# Patient Record
Sex: Female | Born: 1985 | Race: Black or African American | Hispanic: No | Marital: Married | Smoking: Current some day smoker
Health system: Southern US, Community
[De-identification: ages and names within clinical notes are randomized; demographics above are authoritative.]

## PROBLEM LIST (undated history)

## (undated) ENCOUNTER — Inpatient Hospital Stay (HOSPITAL_COMMUNITY): Payer: Self-pay

## (undated) DIAGNOSIS — O34219 Maternal care for unspecified type scar from previous cesarean delivery: Secondary | ICD-10-CM

## (undated) DIAGNOSIS — A599 Trichomoniasis, unspecified: Secondary | ICD-10-CM

## (undated) DIAGNOSIS — T7840XA Allergy, unspecified, initial encounter: Secondary | ICD-10-CM

## (undated) DIAGNOSIS — J301 Allergic rhinitis due to pollen: Secondary | ICD-10-CM

## (undated) DIAGNOSIS — IMO0002 Reserved for concepts with insufficient information to code with codable children: Secondary | ICD-10-CM

## (undated) DIAGNOSIS — R87619 Unspecified abnormal cytological findings in specimens from cervix uteri: Secondary | ICD-10-CM

## (undated) DIAGNOSIS — S060X9A Concussion with loss of consciousness of unspecified duration, initial encounter: Secondary | ICD-10-CM

## (undated) DIAGNOSIS — A630 Anogenital (venereal) warts: Secondary | ICD-10-CM

## (undated) HISTORY — DX: Allergy, unspecified, initial encounter: T78.40XA

## (undated) HISTORY — PX: WISDOM TOOTH EXTRACTION: SHX21

## (undated) HISTORY — PX: ABDOMINAL SURGERY: SHX537

---
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
## 2010-01-05 DIAGNOSIS — A599 Trichomoniasis, unspecified: Secondary | ICD-10-CM

## 2010-01-05 HISTORY — DX: Trichomoniasis, unspecified: A59.9

## 2010-02-13 ENCOUNTER — Emergency Department (HOSPITAL_COMMUNITY): Payer: Medicaid Other

## 2010-02-13 ENCOUNTER — Emergency Department (HOSPITAL_COMMUNITY)
Admission: EM | Admit: 2010-02-13 | Discharge: 2010-02-13 | Disposition: A | Discharge status: Home / Self Care | Payer: Medicaid Other | Attending: Emergency Medicine | Admitting: Emergency Medicine

## 2010-02-13 DIAGNOSIS — O34599 Maternal care for other abnormalities of gravid uterus, unspecified trimester: Secondary | ICD-10-CM | POA: Insufficient documentation

## 2010-02-13 DIAGNOSIS — N83209 Unspecified ovarian cyst, unspecified side: Secondary | ICD-10-CM | POA: Insufficient documentation

## 2010-02-13 DIAGNOSIS — O9989 Other specified diseases and conditions complicating pregnancy, childbirth and the puerperium: Secondary | ICD-10-CM | POA: Insufficient documentation

## 2010-02-13 DIAGNOSIS — R109 Unspecified abdominal pain: Secondary | ICD-10-CM | POA: Insufficient documentation

## 2010-02-13 DIAGNOSIS — N898 Other specified noninflammatory disorders of vagina: Secondary | ICD-10-CM | POA: Insufficient documentation

## 2010-02-13 LAB — URINALYSIS, ROUTINE W REFLEX MICROSCOPIC
Specific Gravity, Urine: 1.031 — ABNORMAL HIGH (ref 1.005–1.030)
Urine Glucose, Fasting: NEGATIVE mg/dL
pH: 5 (ref 5.0–8.0)

## 2010-02-13 LAB — WET PREP, GENITAL

## 2010-02-13 LAB — URINE MICROSCOPIC-ADD ON

## 2010-02-16 LAB — GC/CHLAMYDIA PROBE AMP, GENITAL
Chlamydia, DNA Probe: NEGATIVE
GC Probe Amp, Genital: NEGATIVE

## 2010-03-28 ENCOUNTER — Inpatient Hospital Stay (HOSPITAL_COMMUNITY)
Admission: AD | Admit: 2010-03-28 | Discharge: 2010-03-28 | Disposition: A | Discharge status: Home / Self Care | Payer: Medicaid Other | Source: Ambulatory Visit | Attending: Obstetrics & Gynecology | Admitting: Obstetrics & Gynecology

## 2010-03-28 DIAGNOSIS — O99891 Other specified diseases and conditions complicating pregnancy: Secondary | ICD-10-CM | POA: Insufficient documentation

## 2010-03-28 DIAGNOSIS — R109 Unspecified abdominal pain: Secondary | ICD-10-CM | POA: Insufficient documentation

## 2010-03-28 LAB — URINALYSIS, ROUTINE W REFLEX MICROSCOPIC
Bilirubin Urine: NEGATIVE
Nitrite: NEGATIVE
Specific Gravity, Urine: >1.03 — ABNORMAL HIGH (ref 1.005–1.030)
Urobilinogen, UA: 1 mg/dL (ref 0.0–1.0)

## 2010-03-28 LAB — URINE MICROSCOPIC-ADD ON

## 2010-06-13 ENCOUNTER — Other Ambulatory Visit (HOSPITAL_COMMUNITY): Payer: Self-pay | Admitting: Obstetrics and Gynecology

## 2010-06-13 DIAGNOSIS — O2690 Pregnancy related conditions, unspecified, unspecified trimester: Secondary | ICD-10-CM

## 2010-06-19 ENCOUNTER — Encounter (HOSPITAL_COMMUNITY): Payer: Self-pay

## 2010-06-19 ENCOUNTER — Ambulatory Visit (HOSPITAL_COMMUNITY)
Admission: RE | Admit: 2010-06-19 | Discharge: 2010-06-19 | Disposition: A | Discharge status: Home / Self Care | Payer: Medicaid Other | Source: Ambulatory Visit | Attending: Obstetrics and Gynecology | Admitting: Obstetrics and Gynecology

## 2010-06-19 DIAGNOSIS — Z363 Encounter for antenatal screening for malformations: Secondary | ICD-10-CM | POA: Insufficient documentation

## 2010-06-19 DIAGNOSIS — Z1389 Encounter for screening for other disorder: Secondary | ICD-10-CM | POA: Insufficient documentation

## 2010-06-19 DIAGNOSIS — O2690 Pregnancy related conditions, unspecified, unspecified trimester: Secondary | ICD-10-CM

## 2010-06-19 DIAGNOSIS — O358XX0 Maternal care for other (suspected) fetal abnormality and damage, not applicable or unspecified: Secondary | ICD-10-CM | POA: Insufficient documentation

## 2010-06-27 ENCOUNTER — Other Ambulatory Visit (HOSPITAL_COMMUNITY): Payer: Self-pay | Admitting: Obstetrics and Gynecology

## 2010-06-27 DIAGNOSIS — O358XX0 Maternal care for other (suspected) fetal abnormality and damage, not applicable or unspecified: Secondary | ICD-10-CM

## 2010-07-17 ENCOUNTER — Ambulatory Visit (HOSPITAL_COMMUNITY): Payer: Medicaid Other

## 2010-07-18 ENCOUNTER — Ambulatory Visit (HOSPITAL_COMMUNITY)
Admission: RE | Admit: 2010-07-18 | Discharge: 2010-07-18 | Disposition: A | Discharge status: Home / Self Care | Payer: Medicaid Other | Source: Ambulatory Visit | Attending: Obstetrics and Gynecology | Admitting: Obstetrics and Gynecology

## 2010-07-18 ENCOUNTER — Encounter (HOSPITAL_COMMUNITY): Payer: Self-pay

## 2010-07-18 VITALS — BP 131/80 | HR 115 | Wt 182.0 lb

## 2010-07-18 DIAGNOSIS — Z3689 Encounter for other specified antenatal screening: Secondary | ICD-10-CM | POA: Insufficient documentation

## 2010-07-18 DIAGNOSIS — O358XX0 Maternal care for other (suspected) fetal abnormality and damage, not applicable or unspecified: Secondary | ICD-10-CM | POA: Insufficient documentation

## 2010-07-18 NOTE — Progress Notes (Signed)
Report in AS/EPIC; follow-up in 4 weeks Discussed potential for pediatric renal transplant under special circumstances; will schedule a pediatric nephrology consult predelivery.

## 2010-07-18 NOTE — Progress Notes (Deleted)
Report in AS/EPIC; follow-up in 4 weeks.  Patient will be scheduled to consult with Pediatric nephrology for possibility of renal transplant of surviving newborn.

## 2010-07-18 NOTE — Progress Notes (Deleted)
Report in AS/EPIC; follow-up as needed.  Discussed potential for pediatric renal  transplant under special circumstances; will schedule a pediatric nephrology consult predelivery.

## 2010-08-15 ENCOUNTER — Ambulatory Visit (HOSPITAL_COMMUNITY)
Admission: RE | Admit: 2010-08-15 | Discharge: 2010-08-15 | Disposition: A | Discharge status: Home / Self Care | Payer: Medicaid Other | Source: Ambulatory Visit | Attending: Obstetrics and Gynecology | Admitting: Obstetrics and Gynecology

## 2010-08-15 VITALS — BP 116/72 | HR 100 | Wt 187.0 lb

## 2010-08-15 DIAGNOSIS — Z3689 Encounter for other specified antenatal screening: Secondary | ICD-10-CM | POA: Insufficient documentation

## 2010-08-15 DIAGNOSIS — O358XX0 Maternal care for other (suspected) fetal abnormality and damage, not applicable or unspecified: Secondary | ICD-10-CM | POA: Insufficient documentation

## 2010-08-15 NOTE — Progress Notes (Signed)
Ultrasound in AS/OBGYN/EPIC.  Follow up U/S scheduled 4 weeks.  Patient has asked to have information for visit with [facility] for possiblle delivery/management there. She may still deliver in this area.

## 2010-09-12 ENCOUNTER — Ambulatory Visit (HOSPITAL_COMMUNITY)
Admission: RE | Admit: 2010-09-12 | Discharge: 2010-09-12 | Disposition: A | Discharge status: Home / Self Care | Payer: Medicaid Other | Source: Ambulatory Visit | Attending: Obstetrics and Gynecology | Admitting: Obstetrics and Gynecology

## 2010-09-12 VITALS — BP 113/73 | HR 102 | Wt 197.0 lb

## 2010-09-12 DIAGNOSIS — Z3689 Encounter for other specified antenatal screening: Secondary | ICD-10-CM | POA: Insufficient documentation

## 2010-09-12 DIAGNOSIS — O358XX0 Maternal care for other (suspected) fetal abnormality and damage, not applicable or unspecified: Secondary | ICD-10-CM

## 2010-09-12 NOTE — Progress Notes (Signed)
Report in AS-OBGYN/EPIC; follow-up as needed Patient will continue care with [facility] and local group at her request.

## 2010-10-21 ENCOUNTER — Encounter (HOSPITAL_COMMUNITY): Payer: Self-pay

## 2010-11-11 ENCOUNTER — Inpatient Hospital Stay (HOSPITAL_COMMUNITY): Admission: AD | Admit: 2010-11-11 | Payer: Self-pay | Source: Ambulatory Visit | Admitting: Obstetrics and Gynecology

## 2011-09-17 ENCOUNTER — Other Ambulatory Visit: Payer: Self-pay

## 2011-09-17 LAB — OB RESULTS CONSOLE ABO/RH

## 2011-09-17 LAB — OB RESULTS CONSOLE RPR: RPR: NONREACTIVE

## 2011-09-17 LAB — OB RESULTS CONSOLE RUBELLA ANTIBODY, IGM: Rubella: IMMUNE

## 2011-09-17 LAB — OB RESULTS CONSOLE GC/CHLAMYDIA: Gonorrhea: NEGATIVE

## 2011-09-17 LAB — OB RESULTS CONSOLE HIV ANTIBODY (ROUTINE TESTING): HIV: NONREACTIVE

## 2011-09-25 ENCOUNTER — Ambulatory Visit (HOSPITAL_COMMUNITY): Payer: Medicaid Other | Attending: Obstetrics and Gynecology

## 2011-10-01 ENCOUNTER — Ambulatory Visit (HOSPITAL_COMMUNITY): Payer: Medicaid Other

## 2011-10-08 ENCOUNTER — Ambulatory Visit (HOSPITAL_COMMUNITY)
Admission: RE | Admit: 2011-10-08 | Discharge: 2011-10-08 | Disposition: A | Discharge status: Home / Self Care | Payer: Commercial Managed Care - PPO | Source: Ambulatory Visit | Attending: Obstetrics and Gynecology | Admitting: Obstetrics and Gynecology

## 2011-10-08 ENCOUNTER — Encounter (HOSPITAL_COMMUNITY): Payer: Self-pay

## 2011-10-08 DIAGNOSIS — O352XX0 Maternal care for (suspected) hereditary disease in fetus, not applicable or unspecified: Secondary | ICD-10-CM | POA: Insufficient documentation

## 2011-10-08 NOTE — Progress Notes (Signed)
Genetic Counseling  High-Risk Gestation Note  Appointment Date:  10/08/2011 Referred By: Alyssa Pila, MD Date of Birth:  30-Mar-1985 Partner:  Alyssa Green    Pregnancy History: [id number] Estimated Date of Delivery: 04/08/11 Estimated Gestational Age: [redacted]w[redacted]d Attending: Eulis Foster, MD   I met with [name] and her partner, 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 He was diagnosed postnatally and died at one week of age. The prenatal course was complicated by prenatal ultrasound findings of enlarged, echogenic fetal kidneys and fetal ventriculomegaly. Alyssa Green was born and received diagnosis and care through [facility]. The couple reported that genetic testing was performed and confirmed the diagnosis. Follow-up carrier testing has not yet been performed for [name] or [name]. They reported that follow-up was planned for them at [facility], but they did not keep the appointment given circumstances at the time. The couple reported no known consanguinity, though 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   Carnitine palmitoyltransferase II (CPT II) deficiency is a genetic disorder of long-chain fatty-acid oxidation. Three clinical presentations of CPT II deficiency are described: lethal neonatal form, severe infantile hepatocardiomuscular form, and myopathic form. The lethal neonatal form is characterized by liver failure with hypoketotic hypoglycemia, cardiomyopathy, heart arrhythmias, seizures and coma after fasting or infection, and facial abnormalities or  structural malformations, such as cystic renal dysplasia. Onset is typically within days after birth of the lethal neonatal form, with death typically occurring within days to months. There are reports in the literature of brain and/or renal abnormalities on fetal ultrasound identified in fetuses subsequently diagnosed to have CPT II deficiency.    We reviewed that CPT II deficiency is an autosomal recessive condition, caused by mutations in the CPT2 gene. We reviewed genes, chromosomes, and autosomal recessive inheritance in detail. In autosomal recessive inheritance, an individual has the condition when they inherit two copies of a particular nonworking gene change (mutation) of a particular gene. Carriers describe individuals who have one copy of the nonworking gene change and one copy of the typical gene. Carriers are typically asymptomatic. When both parents are carriers, each pregnancy has a 1 in 4 (25%) chance to inherit both copies of the nonworking gene from the parents and thus the condition. Each pregnancy together for a carrier couple also has a 1 in 2 (50%) chance to be a carrier, and a 1 in 4 (25%) chance to be neither affected nor a carrier. We discussed that males and females have equal chance to inherit the condition. We discussed that [name] and [name] are obligate carriers given that their son had CPT II deficiency. Molecular testing has not yet been performed for the couple to confirm their carrier status. The lethal neonatal form of CPT II deficiency is typically associated with homozygosity for particular severe pathologic variants. Medical records have been requested but not yet received confirming the particular CPT2 mutations identified in the couple's son.   Prenatal diagnosis for CPT II deficiency is available via CVS (approximately 10-[redacted] weeks gestation) or amniocentesis (second trimester) when the causative mutations have been previously identified via  molecular testing. We  reviewed the risks, benefits, and limitations of amniocentesis.  We reviewed the approximate 1 in 300-500 risk for complications, including spontaneous pregnancy loss. We discussed that parental molecular analysis would also need to be performed prior to or in conjunction with prenatal testing to confirm carrier status for the couple. We discussed that turnaround time for prenatal molecular CPT2 testing for the known familial mutation can range from two weeks (in the case that analysis from direct amniotic fluid can be performed) to approximately 4 weeks (in the case that cell culture is required to perform molecular analysis). We also discussed the option of targeted ultrasound in pregnancy given the association with fetal ultrasound findings and CPT II deficiency lethal neonatal form. However, ultrasound cannot identify or rule out all birth defects or genetic conditions, and the absence of fetal ultrasound findings would not rule out the presence of CPT II deficiency in a pregnancy.   We reviewed that options for a pregnancy prenatally diagnosed with CPT II deficiency lethal neonatal form would include continuing the pregnancy with expectant management or termination of pregnancy. The couple understands that the legal limit for TOP in [place] is [redacted] weeks gestation.   After consideration of all the options, the couple elected to return for amniocentesis at approximately [redacted] weeks gestation on 10/22/11 to pursue prenatal molecular testing for CPT II deficiency.  Given that the couple elected to pursue amniocentesis in the pregnancy for CPT II deficiency testing, they also elected to proceed with fetal karyotype analysis from amniocentesis and declined maternal serum fetal aneuploidy screening.   Additionally in the family history, [name] reported a female maternal first cousin with completely reversed organs. She reportedly has no medical issues from this and is healthy. 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 Heterotaxy syndrome is characterized by abnormal development and placement of the organs within the body. This can result in mild features to a poor prognosis depending upon the features present. Heterotaxy can be associated with various patterns of inheritance. In most families, heterotaxy is sporadic, in which case recurrence risk would not be increased for extended relatives.  Heterotaxy has also been described to follow autosomal recessive, X-linked recessive, and autosomal dominant inheritance, as well as be one feature of an underlying genetic syndrome or chromosome condition. In the absence of an identified etiology for [name], recurrence risk for relatives cannot accurately be assessed. However, given the reported family history and degree of relation, recurrence risk for the current pregnancy is likely low. We discussed that targeted ultrasound is available in the second trimester to assess fetal growth and anatomy as well as orientation of fetal organs. The couple understands that ultrasound cannot diagnose or rule out all birth defects or genetic conditions. Without further information regarding the provided family history, an accurate genetic risk cannot be calculated. Further genetic counseling is warranted if more information is obtained.  [name] was provided with written information regarding sickle cell anemia (SCA) including the carrier frequency and incidence in the African-American population, the availability of carrier testing and prenatal diagnosis if indicated.  In addition, we discussed that hemoglobinopathies are routinely screened for as part of the [place] newborn screening panel. [name] previously had hemoglobin electrophoresis, which indicated the presence of normal adult hemoglobin.    [name] denied exposure to environmental toxins or chemical agents. She denied the use of alcohol, tobacco or street drugs.  She denied significant viral illnesses  during the course of her pregnancy. Her medical and surgical histories were noncontributory.   I counseled this couple regarding the above risks and available options.  The approximate face-to-face time with the genetic counselor was 40 minutes.  Quinn Plowman, MS Certified Genetic Counselor 10/08/2011

## 2011-10-21 ENCOUNTER — Other Ambulatory Visit (HOSPITAL_COMMUNITY): Payer: Self-pay | Admitting: Obstetrics and Gynecology

## 2011-10-21 DIAGNOSIS — O09299 Supervision of pregnancy with other poor reproductive or obstetric history, unspecified trimester: Secondary | ICD-10-CM

## 2011-10-22 ENCOUNTER — Ambulatory Visit (HOSPITAL_COMMUNITY)
Admission: RE | Admit: 2011-10-22 | Discharge: 2011-10-22 | Disposition: A | Discharge status: Home / Self Care | Payer: 59 | Source: Ambulatory Visit | Attending: Obstetrics and Gynecology | Admitting: Obstetrics and Gynecology

## 2011-10-22 ENCOUNTER — Other Ambulatory Visit (HOSPITAL_COMMUNITY): Payer: Self-pay | Admitting: Obstetrics and Gynecology

## 2011-10-22 ENCOUNTER — Encounter (HOSPITAL_COMMUNITY): Payer: Self-pay

## 2011-10-22 ENCOUNTER — Other Ambulatory Visit: Payer: Self-pay

## 2011-10-22 VITALS — BP 107/61 | HR 89 | Wt 181.5 lb

## 2011-10-22 DIAGNOSIS — O352XX0 Maternal care for (suspected) hereditary disease in fetus, not applicable or unspecified: Secondary | ICD-10-CM | POA: Insufficient documentation

## 2011-10-22 DIAGNOSIS — O358XX0 Maternal care for other (suspected) fetal abnormality and damage, not applicable or unspecified: Secondary | ICD-10-CM | POA: Insufficient documentation

## 2011-10-22 DIAGNOSIS — Z1389 Encounter for screening for other disorder: Secondary | ICD-10-CM | POA: Insufficient documentation

## 2011-10-22 DIAGNOSIS — O34219 Maternal care for unspecified type scar from previous cesarean delivery: Secondary | ICD-10-CM | POA: Insufficient documentation

## 2011-10-22 DIAGNOSIS — O09299 Supervision of pregnancy with other poor reproductive or obstetric history, unspecified trimester: Secondary | ICD-10-CM

## 2011-10-22 DIAGNOSIS — Z3689 Encounter for other specified antenatal screening: Secondary | ICD-10-CM

## 2011-10-22 DIAGNOSIS — Z363 Encounter for antenatal screening for malformations: Secondary | ICD-10-CM | POA: Insufficient documentation

## 2011-10-22 LAB — AP-AFP (ALPHA FETOPROTEIN)

## 2011-10-22 NOTE — Progress Notes (Signed)
Alyssa Green  was seen today for an ultrasound appointment.  See full report in AS-OB/GYN.  Alpha Gula, MD  Single IUP at 16 0/7 weeks Previous child with CPT II deficiency Somewhat limited views of the fetal anatomy were obtained due to early gestational age - no gross anomalies noted Normal amniotic fluid No marker assocaited with aneuploidy noted.  After counseling, the patient elected to undergo amniocentesis for karyotype and CPT II mutation testing.  No complications - see procedure note above.  Recommend follow up ultrasound in 4 weeks to reevaluate the fetal heart.

## 2011-10-22 NOTE — Progress Notes (Signed)
I spoke to the patient about the amniotic fluid collection research study (IRB# [id number]). The study was explained to the patient and any questions were answered. The patient gave her consent to take part, signed the consent form and copy of the signed form was given to her. The patient's blood was drawn and amniotic fluid collected per the research protocol.  Clydie Braun Arian Mcquitty 10/22/2011

## 2011-11-05 ENCOUNTER — Telehealth (HOSPITAL_COMMUNITY): Payer: Self-pay | Admitting: MS"

## 2011-11-05 NOTE — Telephone Encounter (Signed)
Called Alyssa Green to discuss the results from her amniocentesis. 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 We reviewed that carrier status would not be expected to impact the health of the pregnancy, similar to how carrier status has not affected the health of the patient or her partner. The patient was very happy to hear these results.   Additionally, we reviewed that chromosome analysis from amniocentesis are within normal limits (46,XX).  The patient understands that amniocentesis does not diagnose or rule out all genetic syndrome or birth defects. All questions were answered to her satisfaction, she was encouraged to call with additional questions or concerns.   Quinn Plowman, MS Certified Genetic Counselor 11/06/2011 11:24 AM        Left message for patient to return call regarding "good news."  Clydie Braun Valda Christenson 11/05/2011 9:46 AM

## 2011-11-19 ENCOUNTER — Ambulatory Visit (HOSPITAL_COMMUNITY)
Admission: RE | Admit: 2011-11-19 | Discharge: 2011-11-19 | Disposition: A | Discharge status: Home / Self Care | Payer: 59 | Source: Ambulatory Visit | Attending: Obstetrics and Gynecology | Admitting: Obstetrics and Gynecology

## 2011-11-19 DIAGNOSIS — O09299 Supervision of pregnancy with other poor reproductive or obstetric history, unspecified trimester: Secondary | ICD-10-CM

## 2011-11-19 DIAGNOSIS — O34219 Maternal care for unspecified type scar from previous cesarean delivery: Secondary | ICD-10-CM | POA: Insufficient documentation

## 2011-11-19 DIAGNOSIS — O352XX0 Maternal care for (suspected) hereditary disease in fetus, not applicable or unspecified: Secondary | ICD-10-CM | POA: Insufficient documentation

## 2011-11-19 DIAGNOSIS — Z3689 Encounter for other specified antenatal screening: Secondary | ICD-10-CM

## 2011-11-19 NOTE — Progress Notes (Signed)
[facility] Ultrasound  Indication: 26 yr old G47P1010 at [redacted]w[redacted]d with previous child who died from CPT II deficiency for fetal anatomic survey.  Findings: 1. Single intrauterine pregnancy. 2. Fetal biometry is consistent with dating. 3. Posterior placenta without evidence of previa. 4. Normal amniotic fluid volume. 5. Normla transabdominal cervical length. 6. Normal fetal anatomic survey. The posterior fossa was not well visualized on today's exam but was evaluated  on the previous exam.  Recommendations: 1. Appropriate fetal growth. 2. Normal fetal anatomic survey. 3. 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 Recommend follow up as clinically indicated.  Eulis Foster, MD

## 2012-01-06 HISTORY — DX: Maternal care for unspecified type scar from previous cesarean delivery: O34.219

## 2012-01-06 NOTE — L&D Delivery Note (Signed)
Delivery Note At 1:10 PM a viable and healthy female was delivered via VBAC, Spontaneous (Presentation: OA; LOT ).  APGAR: 9, 9; weight P .   Placenta status: Intact, Spontaneous.  Cord: 3 vessels with the following complications: None.   Anesthesia: Epidural  Episiotomy: None Lacerations: 1st degree;B Labial;Vaginal Suture Repair: 3.0 vicryl rapide Est. Blood Loss (mL): 400  Mom to postpartum.  Baby to stay with Mom and Dad.  BOVARD,Takyia Sindt 04/11/2012, 1:55 PM  Br/ ?Contra?/ O+/ RI

## 2012-03-07 ENCOUNTER — Inpatient Hospital Stay (HOSPITAL_COMMUNITY)
Admission: AD | Admit: 2012-03-07 | Discharge: 2012-03-07 | Disposition: A | Discharge status: Hospice | Payer: 59 | Source: Ambulatory Visit | Attending: Obstetrics and Gynecology | Admitting: Obstetrics and Gynecology

## 2012-03-07 ENCOUNTER — Encounter (HOSPITAL_COMMUNITY): Payer: Self-pay | Admitting: *Deleted

## 2012-03-07 DIAGNOSIS — Y92410 Unspecified street and highway as the place of occurrence of the external cause: Secondary | ICD-10-CM | POA: Insufficient documentation

## 2012-03-07 DIAGNOSIS — O99891 Other specified diseases and conditions complicating pregnancy: Secondary | ICD-10-CM | POA: Insufficient documentation

## 2012-03-07 DIAGNOSIS — M545 Low back pain, unspecified: Secondary | ICD-10-CM | POA: Insufficient documentation

## 2012-03-07 DIAGNOSIS — O9A213 Injury, poisoning and certain other consequences of external causes complicating pregnancy, third trimester: Secondary | ICD-10-CM
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
## 2012-03-11 NOTE — Progress Notes (Signed)
FHT from 3-3 reviewed.  Reactive NST, no significant ctx.

## 2012-04-01 ENCOUNTER — Inpatient Hospital Stay (HOSPITAL_COMMUNITY)
Admission: AD | Admit: 2012-04-01 | Discharge: 2012-04-01 | Disposition: A | Discharge status: Home / Self Care | Payer: 59 | Source: Ambulatory Visit | Attending: Obstetrics and Gynecology | Admitting: Obstetrics and Gynecology

## 2012-04-01 ENCOUNTER — Encounter (HOSPITAL_COMMUNITY): Payer: Self-pay | Admitting: *Deleted

## 2012-04-01 DIAGNOSIS — O99891 Other specified diseases and conditions complicating pregnancy: Secondary | ICD-10-CM | POA: Insufficient documentation

## 2012-04-01 LAB — POCT FERN TEST: POCT Fern Test: NEGATIVE

## 2012-04-01 NOTE — MAU Note (Signed)
Leaking fld since 1700. Clear fld. No pain.

## 2012-04-10 ENCOUNTER — Inpatient Hospital Stay (HOSPITAL_COMMUNITY): Payer: 59 | Admitting: Anesthesiology

## 2012-04-10 ENCOUNTER — Inpatient Hospital Stay (HOSPITAL_COMMUNITY)
Admission: AD | Admit: 2012-04-10 | Discharge: 2012-04-10 | Disposition: A | Discharge status: Hospice | Payer: 59 | Source: Ambulatory Visit | Attending: Obstetrics and Gynecology | Admitting: Obstetrics and Gynecology

## 2012-04-10 ENCOUNTER — Encounter (HOSPITAL_COMMUNITY): Payer: Self-pay | Admitting: *Deleted

## 2012-04-10 ENCOUNTER — Inpatient Hospital Stay (HOSPITAL_COMMUNITY)
Admission: AD | Admit: 2012-04-10 | Discharge: 2012-04-13 | DRG: 774 | Disposition: A | Discharge status: Home / Self Care | Payer: 59 | Source: Ambulatory Visit | Attending: Obstetrics and Gynecology | Admitting: Obstetrics and Gynecology

## 2012-04-10 ENCOUNTER — Encounter (HOSPITAL_COMMUNITY): Payer: Self-pay | Admitting: Anesthesiology

## 2012-04-10 DIAGNOSIS — O4290 Premature rupture of membranes, unspecified as to length of time between rupture and onset of labor, unspecified weeks of gestation: Secondary | ICD-10-CM | POA: Diagnosis present

## 2012-04-10 DIAGNOSIS — O479 False labor, unspecified: Secondary | ICD-10-CM | POA: Insufficient documentation

## 2012-04-10 DIAGNOSIS — O98519 Other viral diseases complicating pregnancy, unspecified trimester: Secondary | ICD-10-CM | POA: Diagnosis present

## 2012-04-10 DIAGNOSIS — A630 Anogenital (venereal) warts: Secondary | ICD-10-CM | POA: Diagnosis present

## 2012-04-10 DIAGNOSIS — O34219 Maternal care for unspecified type scar from previous cesarean delivery: Secondary | ICD-10-CM | POA: Diagnosis present

## 2012-04-10 HISTORY — DX: Allergic rhinitis due to pollen: J30.1

## 2012-04-10 HISTORY — DX: Concussion with loss of consciousness of unspecified duration, initial encounter: S06.0X9A

## 2012-04-10 HISTORY — DX: Reserved for concepts with insufficient information to code with codable children: IMO0002

## 2012-04-10 HISTORY — DX: Anogenital (venereal) warts: A63.0

## 2012-04-10 HISTORY — DX: Unspecified abnormal cytological findings in specimens from cervix uteri: R87.619

## 2012-04-10 HISTORY — DX: Trichomoniasis, unspecified: A59.9

## 2012-04-10 LAB — TYPE AND SCREEN: Antibody Screen: NEGATIVE

## 2012-04-10 LAB — CBC
HCT: 35 % — ABNORMAL LOW (ref 36.0–46.0)
MCH: 29.4 pg (ref 26.0–34.0)
MCV: 89.5 fL (ref 78.0–100.0)
Platelets: 164 10*3/uL (ref 150–400)
RDW: 14.6 % (ref 11.5–15.5)
WBC: 12.1 10*3/uL — ABNORMAL HIGH (ref 4.0–10.5)

## 2012-04-10 MED ORDER — LACTATED RINGERS IV SOLN
INTRAVENOUS | Status: DC
  Administered 2012-04-10 – 2012-04-11 (×4): via INTRAVENOUS

## 2012-04-10 MED ORDER — BUTORPHANOL TARTRATE 1 MG/ML IJ SOLN
1.0000 mg | Freq: Once | INTRAMUSCULAR | Status: AC
  Administered 2012-04-10: 1 mg via INTRAVENOUS
  Filled 2012-04-10: qty 1

## 2012-04-10 MED ORDER — EPHEDRINE 5 MG/ML INJ
10.0000 mg | INTRAVENOUS | Status: DC | PRN
  Filled 2012-04-10: qty 4
  Filled 2012-04-10: qty 2

## 2012-04-10 MED ORDER — OXYTOCIN BOLUS FROM INFUSION: 500.0000 mL | INTRAVENOUS | Status: DC

## 2012-04-10 MED ORDER — OXYTOCIN 40 UNITS IN LACTATED RINGERS INFUSION - SIMPLE MED: 62.5000 mL/h | INTRAVENOUS | Status: DC

## 2012-04-10 MED ORDER — ACETAMINOPHEN 325 MG PO TABS: 650.0000 mg | ORAL_TABLET | ORAL | Status: DC | PRN

## 2012-04-10 MED ORDER — ONDANSETRON HCL 4 MG/2ML IJ SOLN: 4.0000 mg | Freq: Four times a day (QID) | INTRAMUSCULAR | Status: DC | PRN

## 2012-04-10 MED ORDER — EPHEDRINE 5 MG/ML INJ
10.0000 mg | INTRAVENOUS | Status: DC | PRN
  Filled 2012-04-10: qty 2

## 2012-04-10 MED ORDER — LIDOCAINE HCL (PF) 1 % IJ SOLN
30.0000 mL | INTRAMUSCULAR | Status: DC | PRN
  Filled 2012-04-10: qty 30

## 2012-04-10 MED ORDER — PHENYLEPHRINE 40 MCG/ML (10ML) SYRINGE FOR IV PUSH (FOR BLOOD PRESSURE SUPPORT)
80.0000 ug | PREFILLED_SYRINGE | INTRAVENOUS | Status: DC | PRN
  Filled 2012-04-10: qty 2

## 2012-04-10 MED ORDER — LACTATED RINGERS IV SOLN
500.0000 mL | INTRAVENOUS | Status: DC | PRN
  Administered 2012-04-10: 500 mL via INTRAVENOUS

## 2012-04-10 MED ORDER — LACTATED RINGERS IV SOLN
500.0000 mL | Freq: Once | INTRAVENOUS | Status: AC
  Administered 2012-04-10: 500 mL via INTRAVENOUS

## 2012-04-10 MED ORDER — DIPHENHYDRAMINE HCL 50 MG/ML IJ SOLN: 12.5000 mg | INTRAMUSCULAR | Status: DC | PRN

## 2012-04-10 MED ORDER — OXYCODONE-ACETAMINOPHEN 5-325 MG PO TABS: 1.0000 | ORAL_TABLET | ORAL | Status: DC | PRN

## 2012-04-10 MED ORDER — CITRIC ACID-SODIUM CITRATE 334-500 MG/5ML PO SOLN: 30.0000 mL | ORAL | Status: DC | PRN

## 2012-04-10 MED ORDER — IBUPROFEN 600 MG PO TABS
600.0000 mg | ORAL_TABLET | Freq: Four times a day (QID) | ORAL | Status: DC | PRN
  Administered 2012-04-11: 600 mg via ORAL
  Filled 2012-04-10: qty 1

## 2012-04-10 MED ORDER — PHENYLEPHRINE 40 MCG/ML (10ML) SYRINGE FOR IV PUSH (FOR BLOOD PRESSURE SUPPORT)
80.0000 ug | PREFILLED_SYRINGE | INTRAVENOUS | Status: DC | PRN
  Filled 2012-04-10: qty 2
  Filled 2012-04-10: qty 5

## 2012-04-10 MED ORDER — FENTANYL 2.5 MCG/ML BUPIVACAINE 1/10 % EPIDURAL INFUSION (WH - ANES)
14.0000 mL/h | INTRAMUSCULAR | Status: DC | PRN
  Administered 2012-04-10 – 2012-04-11 (×3): 14 mL/h via EPIDURAL
  Filled 2012-04-10 (×3): qty 125

## 2012-04-10 MED ORDER — LIDOCAINE HCL (PF) 1 % IJ SOLN
INTRAMUSCULAR | Status: DC | PRN
  Administered 2012-04-10 (×4): 4 mL

## 2012-04-10 NOTE — H&P (Signed)
Alyssa Green is a 27 y.o. female, G3 P1011, EGA [redacted] weeks with [facility] 4-3 presenting for evaluation of leaking fluid.  Initial eval in MAU unable to confirm ROM and irreg ctx.  She was discharged home but returned shortly after that with increased leaking and ROM confirmed.  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  She is CTOL and desires VBAC.  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  See prenatal records for complete history.  Maternal Medical History:  Reason for admission: Rupture of membranes.   Fetal activity: Perceived fetal activity is normal.      OB History   Grav Para Term Preterm Abortions TAB SAB Ect Mult Living   3 1 1  0 1 0 1 0 0 0    LTCS at term for fetal anomaly, ( lbs 4 oz, baby died at one week of age SAB  Past Medical History  Diagnosis Date  . Medical history non-contributory    Past Surgical History  Procedure Laterality Date  . Cesarean section    . Wisdom tooth extraction     Family History: family history includes Arthritis in her maternal grandmother; Cancer in her maternal aunt, maternal grandmother, maternal uncle, and mother; Diabetes in her father; Hypertension in her father; and Other in her son. Social History:  reports that she quit smoking about 10 months ago. Her smoking use included Cigars. She has never used smokeless tobacco. She reports that  drinks alcohol. She reports that she does not use illicit drugs.   Prenatal Transfer Tool  Maternal Diabetes: No Genetic Screening: Normal Maternal Ultrasounds/Referrals: Normal Fetal Ultrasounds or other Referrals:  Referred to Materal Fetal Medicine  Maternal Substance Abuse:  No Significant Maternal Medications:  None Significant Maternal Lab Results:  Lab values include: Group B Strep negative Other Comments:  None  Review of Systems   Respiratory: Negative.   Cardiovascular: Negative.     Dilation: 1 Effacement (%): 70 Station: -2 Exam by:: Dr Jackelyn Knife Blood pressure 108/67, pulse 97, temperature 98.6 F (37 C), temperature source Oral, resp. rate 20, height 5\' 8"  (1.727 m), weight 96.616 kg (213 lb), last menstrual period 07/02/2011. Maternal Exam:  Uterine Assessment: Contraction strength is moderate.  Contraction frequency is irregular.   Abdomen: Patient reports no abdominal tenderness. Estimated fetal weight is 8 lbs.   Fetal presentation: vertex  Introitus: Normal vulva. Normal vagina.  Ferning test: positive.  Amniotic fluid character: clear.  Pelvis: adequate for delivery.   Cervix: Cervix evaluated by digital exam.     Fetal Exam Fetal Monitor Review: Mode: ultrasound.   Variability: moderate (6-25 bpm).   Pattern: accelerations present.    Fetal State Assessment: Category I - tracings are normal.     Physical Exam  Constitutional: She appears well-developed and well-nourished.  Cardiovascular: Normal rate, regular rhythm and normal heart sounds.   No murmur heard. Respiratory: Effort normal and breath sounds normal. No respiratory distress. She has no wheezes.  GI: Soft.  Gravid, transverse scar    Prenatal labs: ABO, Rh: --/--/O POS, O POS (04/06 1820) Antibody: NEG (04/06 1820) Rubella: Immune (09/12 0000) RPR: Nonreactive (09/12 0000)  HBsAg: Negative (09/12 0000)  HIV: Non-reactive (09/12 0000)  GBS:   neg GCT:  Nl  Assessment/Plan: IUP at 40 weeks with PROM, starting to go into labor.  Previous c-section,  CTOL and wants VBAC.  Will monitor progress.     Alyssa Green D 04/10/2012, 8:53 PM

## 2012-04-10 NOTE — Anesthesia Preprocedure Evaluation (Signed)
Anesthesia Evaluation  Patient identified by MRN, date of birth, ID band Patient awake    Reviewed: Allergy & Precautions, H&P , NPO status , Patient's Chart, lab work & pertinent test results, reviewed documented beta blocker date and time   History of Anesthesia Complications Negative for: history of anesthetic complications  Airway Mallampati: II TM Distance: >3 FB Neck ROM: full    Dental  (+) Teeth Intact   Pulmonary former smoker (quit 6/13),  breath sounds clear to auscultation        Cardiovascular negative cardio ROS  Rhythm:regular Rate:Normal     Neuro/Psych negative neurological ROS  negative psych ROS   GI/Hepatic negative GI ROS, Neg liver ROS,   Endo/Other  negative endocrine ROS  Renal/GU negative Renal ROS     Musculoskeletal   Abdominal   Peds  Hematology negative hematology ROS (+)   Anesthesia Other Findings   Reproductive/Obstetrics (+) Pregnancy (h/o c/s x1, baby did not survive - attempting TOLAC)                           Anesthesia Physical Anesthesia Plan  ASA: II  Anesthesia Plan: Epidural   Post-op Pain Management:    Induction:   Airway Management Planned:   Additional Equipment:   Intra-op Plan:   Post-operative Plan:   Informed Consent: I have reviewed the patients History and Physical, chart, labs and discussed the procedure including the risks, benefits and alternatives for the proposed anesthesia with the patient or authorized representative who has indicated his/her understanding and acceptance.     Plan Discussed with:   Anesthesia Plan Comments:         Anesthesia Quick Evaluation

## 2012-04-10 NOTE — Anesthesia Procedure Notes (Signed)
Epidural Patient location during procedure: OB Start time: 04/10/2012 8:50 PM  Staffing Performed by: anesthesiologist   Preanesthetic Checklist Completed: patient identified, site marked, surgical consent, pre-op evaluation, timeout performed, IV checked, risks and benefits discussed and monitors and equipment checked  Epidural Patient position: sitting Prep: site prepped and draped and DuraPrep Patient monitoring: continuous pulse ox and blood pressure Approach: midline Injection technique: LOR air  Needle:  Needle type: Tuohy  Needle gauge: 17 G Needle length: 9 cm and 9 Needle insertion depth: 5 cm cm Catheter type: closed end flexible Catheter size: 19 Gauge Catheter at skin depth: 10 cm Test dose: negative  Assessment Events: blood not aspirated, injection not painful, no injection resistance, negative IV test and no paresthesia  Additional Notes Discussed risk of headache, infection, bleeding, nerve injury and failed or incomplete block.  Patient voices understanding and wishes to proceed.  Epidural placed easily on first attempt.  No paresthesia.  Patient tolerated procedure well with no apparent complications.  Jasmine December, MD Reason for block:procedure for pain

## 2012-04-10 NOTE — MAU Note (Signed)
Pt reports having ctx since 1 am q  15 -20 min apart. Pt reprots some pink discharge no gush of fluid. Fetal movement little less than usual.

## 2012-04-11 ENCOUNTER — Encounter (HOSPITAL_COMMUNITY): Payer: Self-pay | Admitting: Obstetrics and Gynecology

## 2012-04-11 DIAGNOSIS — O34219 Maternal care for unspecified type scar from previous cesarean delivery: Secondary | ICD-10-CM

## 2012-04-11 MED ORDER — OXYCODONE-ACETAMINOPHEN 5-325 MG PO TABS: 1.0000 | ORAL_TABLET | ORAL | Status: DC | PRN

## 2012-04-11 MED ORDER — WITCH HAZEL-GLYCERIN EX PADS
1.0000 "application " | MEDICATED_PAD | CUTANEOUS | Status: DC | PRN
  Administered 2012-04-11: 1 via TOPICAL

## 2012-04-11 MED ORDER — TERBUTALINE SULFATE 1 MG/ML IJ SOLN: 0.2500 mg | Freq: Once | INTRAMUSCULAR | Status: DC | PRN

## 2012-04-11 MED ORDER — IBUPROFEN 600 MG PO TABS
600.0000 mg | ORAL_TABLET | Freq: Four times a day (QID) | ORAL | Status: DC
  Administered 2012-04-11 – 2012-04-13 (×7): 600 mg via ORAL
  Filled 2012-04-11 (×7): qty 1

## 2012-04-11 MED ORDER — CITRIC ACID-SODIUM CITRATE 334-500 MG/5ML PO SOLN
ORAL | Status: AC
  Filled 2012-04-11: qty 15

## 2012-04-11 MED ORDER — TETANUS-DIPHTH-ACELL PERTUSSIS 5-2.5-18.5 LF-MCG/0.5 IM SUSP
0.5000 mL | Freq: Once | INTRAMUSCULAR | Status: DC
  Filled 2012-04-11: qty 0.5

## 2012-04-11 MED ORDER — PRENATAL MULTIVITAMIN CH
1.0000 | ORAL_TABLET | Freq: Every day | ORAL | Status: DC
  Administered 2012-04-12: 1 via ORAL
  Filled 2012-04-11: qty 1

## 2012-04-11 MED ORDER — ONDANSETRON HCL 4 MG PO TABS: 4.0000 mg | ORAL_TABLET | ORAL | Status: DC | PRN

## 2012-04-11 MED ORDER — SENNOSIDES-DOCUSATE SODIUM 8.6-50 MG PO TABS
2.0000 | ORAL_TABLET | Freq: Every day | ORAL | Status: DC
  Administered 2012-04-11 – 2012-04-12 (×2): 2 via ORAL

## 2012-04-11 MED ORDER — LANOLIN HYDROUS EX OINT: TOPICAL_OINTMENT | CUTANEOUS | Status: DC | PRN

## 2012-04-11 MED ORDER — BENZOCAINE-MENTHOL 20-0.5 % EX AERO
1.0000 "application " | INHALATION_SPRAY | CUTANEOUS | Status: DC | PRN
  Administered 2012-04-11: 1 via TOPICAL
  Filled 2012-04-11 (×2): qty 56

## 2012-04-11 MED ORDER — DIPHENHYDRAMINE HCL 25 MG PO CAPS: 25.0000 mg | ORAL_CAPSULE | Freq: Four times a day (QID) | ORAL | Status: DC | PRN

## 2012-04-11 MED ORDER — DIBUCAINE 1 % RE OINT
1.0000 "application " | TOPICAL_OINTMENT | RECTAL | Status: DC | PRN
  Filled 2012-04-11: qty 28

## 2012-04-11 MED ORDER — LACTATED RINGERS IV SOLN: INTRAVENOUS | Status: DC

## 2012-04-11 MED ORDER — SIMETHICONE 80 MG PO CHEW: 80.0000 mg | CHEWABLE_TABLET | ORAL | Status: DC | PRN

## 2012-04-11 MED ORDER — OXYTOCIN 40 UNITS IN LACTATED RINGERS INFUSION - SIMPLE MED
1.0000 m[IU]/min | INTRAVENOUS | Status: DC
  Administered 2012-04-11: 1 m[IU]/min via INTRAVENOUS
  Filled 2012-04-11: qty 1000

## 2012-04-11 MED ORDER — ZOLPIDEM TARTRATE 5 MG PO TABS: 5.0000 mg | ORAL_TABLET | Freq: Every evening | ORAL | Status: DC | PRN

## 2012-04-11 MED ORDER — ONDANSETRON HCL 4 MG/2ML IJ SOLN: 4.0000 mg | INTRAMUSCULAR | Status: DC | PRN

## 2012-04-11 NOTE — Progress Notes (Signed)
Patient ID: Alyssa Green, female   DOB: 02-12-1985, 27 y.o.   MRN: [id number]  Pt with pressure.  AF VSS gen NAD  FHTs 150's mod var, accels toco q 2-12min  SVE 7.5/80/+1 IUPC placed Pitocin started, after d/w pt  Expect SVD (VBAC)

## 2012-04-12 ENCOUNTER — Inpatient Hospital Stay (HOSPITAL_COMMUNITY): Admission: RE | Admit: 2012-04-12 | Payer: 59 | Source: Ambulatory Visit

## 2012-04-12 LAB — CBC
HCT: 31.2 % — ABNORMAL LOW (ref 36.0–46.0)
MCH: 29.3 pg (ref 26.0–34.0)
MCV: 89.7 fL (ref 78.0–100.0)
Platelets: 139 10*3/uL — ABNORMAL LOW (ref 150–400)
RBC: 3.48 MIL/uL — ABNORMAL LOW (ref 3.87–5.11)
RDW: 14.8 % (ref 11.5–15.5)
WBC: 22.1 10*3/uL — ABNORMAL HIGH (ref 4.0–10.5)

## 2012-04-12 NOTE — Progress Notes (Signed)
Post Partum Day 1 Subjective: no complaints, up ad lib, tolerating PO and nl lochia, pain controlled  Objective: Blood pressure 101/68, pulse 74, temperature 98 F (36.7 C), temperature source Oral, resp. rate 18, height 5\' 8"  (1.727 m), weight 96.616 kg (213 lb), last menstrual period 07/02/2011, SpO2 100.00%, unknown if currently breastfeeding.  Physical Exam:  General: alert and no distress Lochia: appropriate Uterine Fundus: firm    Recent Labs  04/10/12 1820 04/12/12 0639  HGB 11.5* 10.2*  HCT 35.0* 31.2*    Assessment/Plan: Plan for discharge tomorrow, Breastfeeding and Lactation consult.  Routine care.      LOS: 2 days   BOVARD,Alyssa Green 04/12/2012, 8:33 AM

## 2012-04-12 NOTE — Anesthesia Postprocedure Evaluation (Signed)
  Anesthesia Post-op Note  Patient: Alyssa Green  Procedure(s) Performed: Procedure(s): CESAREAN SECTION REPEAT (N/A)  Patient Location: Mother/Baby  Anesthesia Type:Epidural  Level of Consciousness: awake  Airway and Oxygen Therapy: Patient Spontanous Breathing  Post-op Pain: none  Post-op Assessment: Post-op Vital signs reviewed, PATIENT'S CARDIOVASCULAR STATUS UNSTABLE, Patent Airway, No signs of Nausea or vomiting, Adequate PO intake, Pain level controlled, No headache, No backache, No residual numbness and No residual motor weakness  Post-op Vital Signs: Reviewed and stable  Complications: No apparent anesthesia complications

## 2012-04-13 MED ORDER — IBUPROFEN 800 MG PO TABS: 800.0000 mg | ORAL_TABLET | Freq: Three times a day (TID) | ORAL | Status: DC | PRN

## 2012-04-13 MED ORDER — OXYCODONE-ACETAMINOPHEN 5-325 MG PO TABS: 1.0000 | ORAL_TABLET | Freq: Four times a day (QID) | ORAL | Status: DC | PRN

## 2012-04-13 MED ORDER — PRENATAL MULTIVITAMIN CH: 1.0000 | ORAL_TABLET | Freq: Every day | ORAL | Status: DC

## 2012-04-13 NOTE — Progress Notes (Signed)
Post Partum Day 2 Subjective: no complaints, voiding, tolerating PO and nl lochia, pain controlled  Objective: Blood pressure 121/78, pulse 84, temperature 98.4 F (36.9 C), temperature source Oral, resp. rate 20, height 5\' 8"  (1.727 m), weight 96.616 kg (213 lb), last menstrual period 07/02/2011, SpO2 100.00%, unknown if currently breastfeeding.  Physical Exam:  General: alert and no distress Lochia: appropriate Uterine Fundus: firm   Recent Labs  04/10/12 1820 04/12/12 0639  HGB 11.5* 10.2*  HCT 35.0* 31.2*    Assessment/Plan: Discharge home, Breastfeeding and Lactation consult. D/C with Motrin, percocet, PNV, f/u 6 weeks.     LOS: 3 days   Green,Alyssa Pequignot 04/13/2012, 8:22 AM

## 2012-04-13 NOTE — Progress Notes (Signed)
UR chart review completed.  

## 2012-04-13 NOTE — Discharge Summary (Signed)
Obstetric Discharge Summary Reason for Admission: rupture of membranes Prenatal Procedures: none Intrapartum Procedures: spontaneous vaginal delivery and successful VBAC Postpartum Procedures: none Complications-Operative and Postpartum: 1st degree perineal laceration and vaginal laceration Hemoglobin  Date Value Range Status  04/12/2012 10.2* 12.0 - 15.0 g/dL Final     HCT  Date Value Range Status  04/12/2012 31.2* 36.0 - 46.0 % Final    Physical Exam:  General: alert and no distress Lochia: appropriate Uterine Fundus: firm  Discharge Diagnoses: Term Pregnancy-delivered  Discharge Information: Date: 04/13/2012 Activity: pelvic rest Diet: routine Medications: PNV, Ibuprofen and Percocet Condition: stable Instructions: refer to practice specific booklet Discharge to: home Follow-up Information   Follow up with Alyssa Green,Alyssa Vipond, MD. Schedule an appointment as soon as possible for a visit in 6 weeks.   Contact information:   [address] [place] [address] [phone]       Newborn Data: Live born female  Birth Weight: 8 lb 11 oz (3941 g) APGAR: 9, 9  Home with mother.  Alyssa Green,[name] 04/13/2012, 8:50 AM

## 2012-04-14 ENCOUNTER — Inpatient Hospital Stay (HOSPITAL_COMMUNITY): Admission: RE | Admit: 2012-04-14 | Payer: 59 | Source: Ambulatory Visit | Admitting: Obstetrics and Gynecology

## 2012-04-14 ENCOUNTER — Encounter (HOSPITAL_COMMUNITY): Admission: RE | Payer: Self-pay | Source: Ambulatory Visit

## 2012-04-14 SURGERY — Surgical Case
Anesthesia: Regional

## 2012-06-09 IMAGING — US US OB FOLLOW-UP
1 series · 14 of 28 positions shown · non-contrast
Comparison: none

[Series 1: us ob follow-up · 0.20mm/px · 14 of 70 slices shown]
[im 3/70]
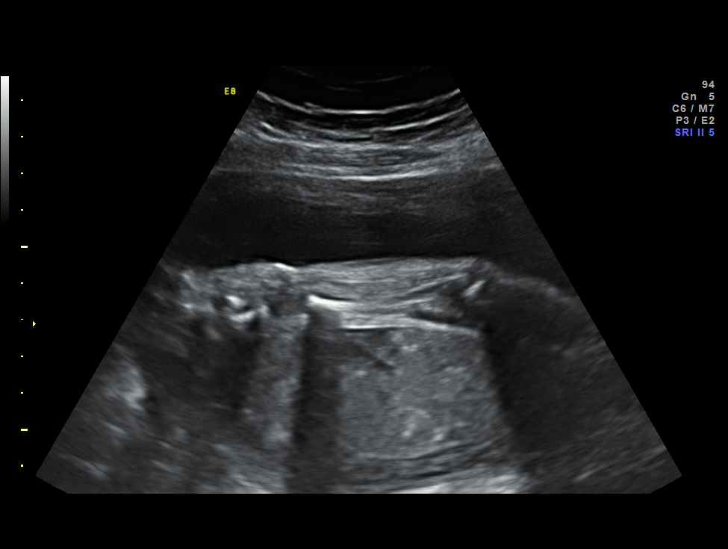
[im 8/70]
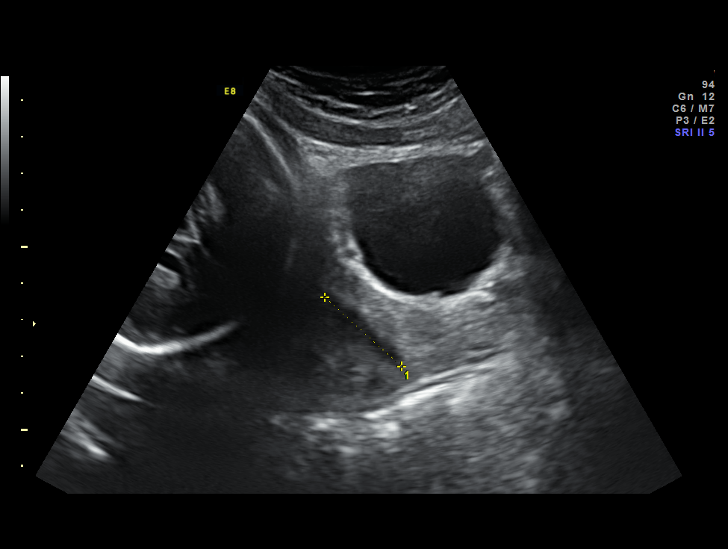
[im 13/70]
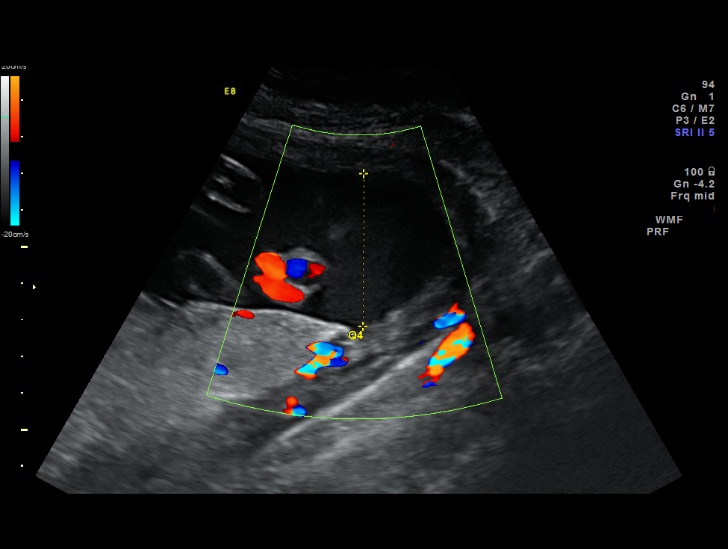
[im 18/70]
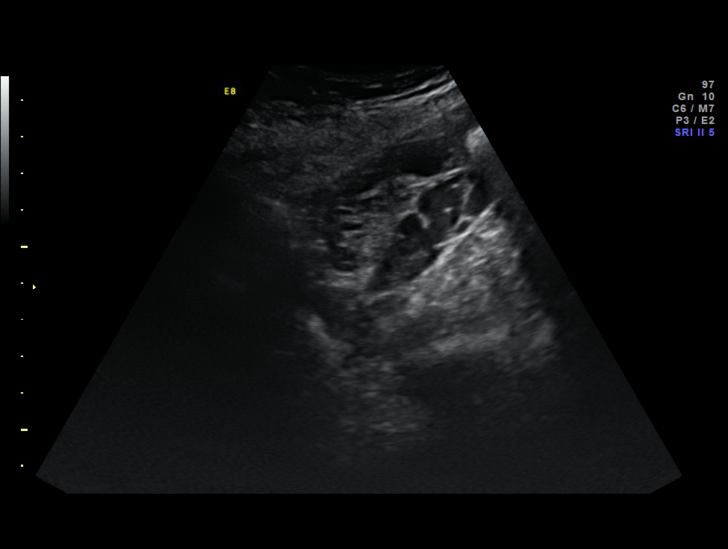
[im 24/70]
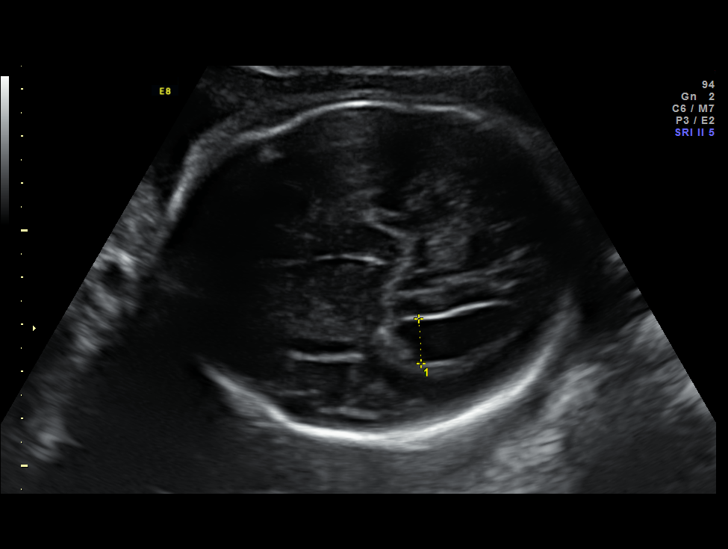
[im 29/70]
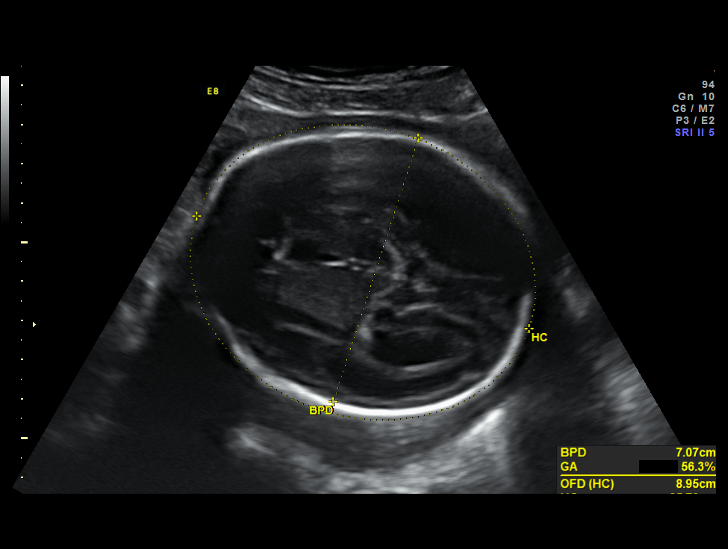
[im 34/70]
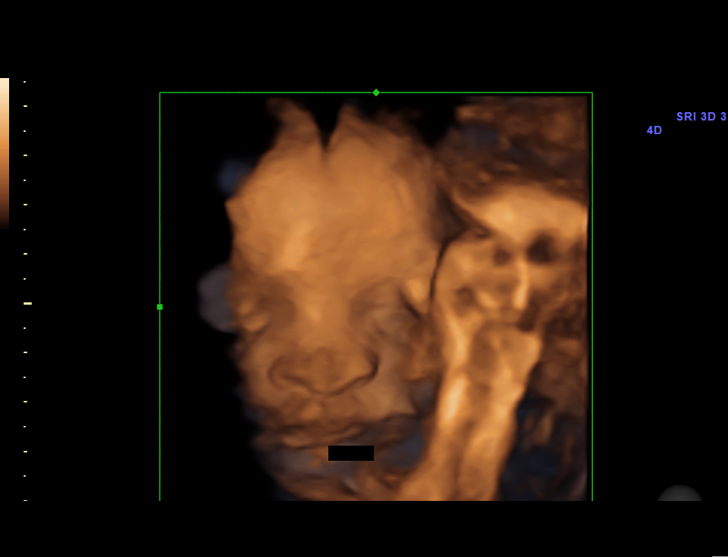
[im 39/70]
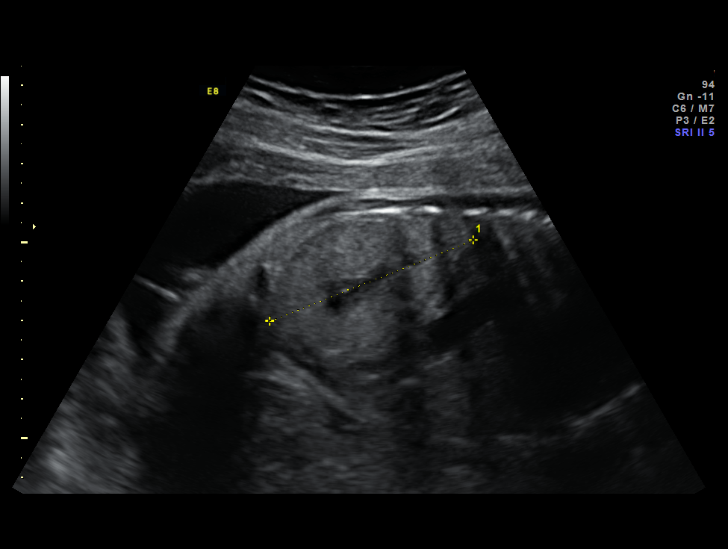
[im 44/70]
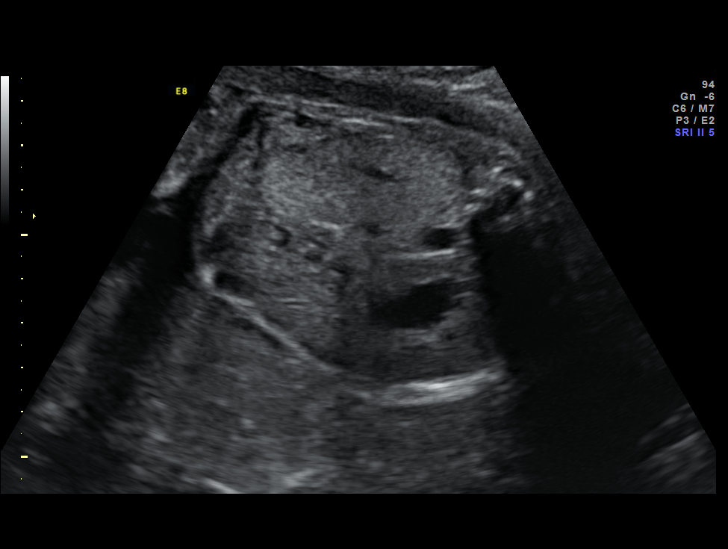
[im 49/70]
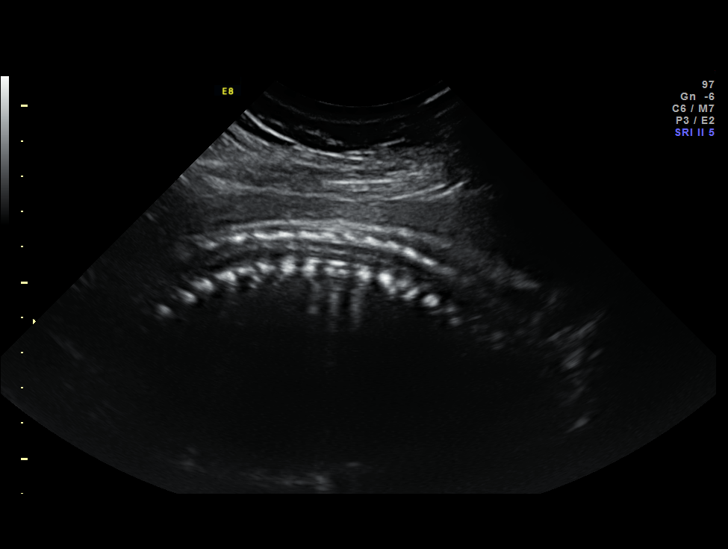
[im 54/70]
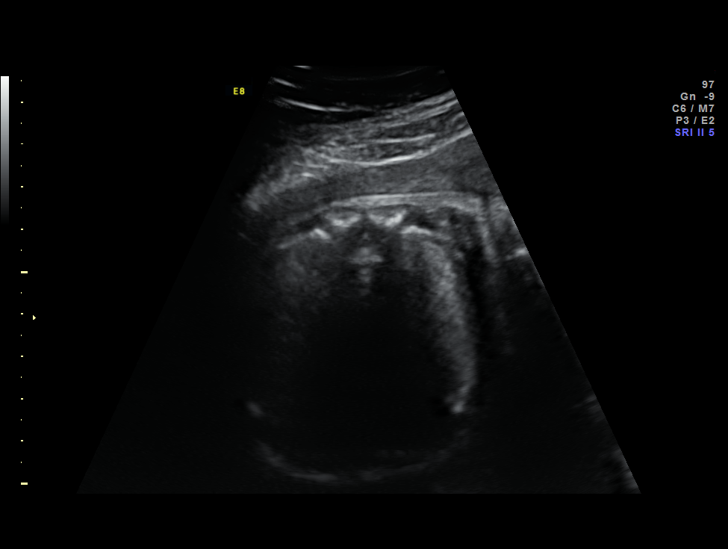
[im 59/70]
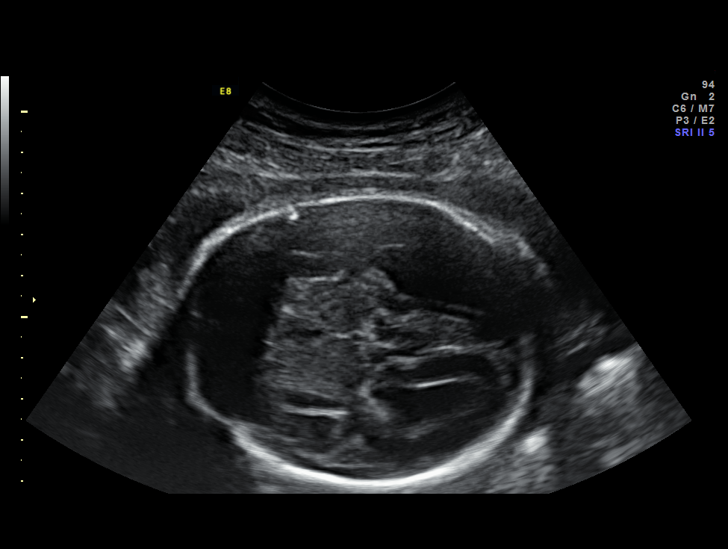
[im 64/70]
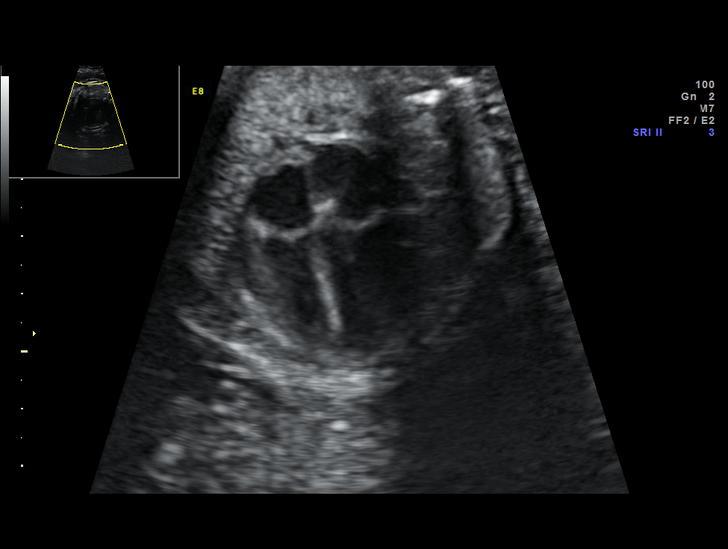
[im 70/70]
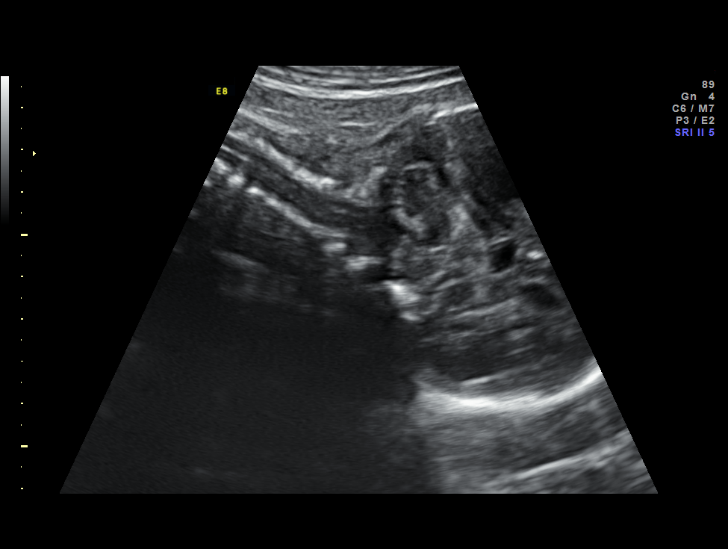

[14 of 28 positions shown; findings below may reference images not displayed]

Canned report from images found in remote index.

Refer to host system for actual result text.

## 2012-08-04 IMAGING — US US OB FOLLOW-UP
1 series · 14 of 28 positions shown · non-contrast
Comparison: none

[Series 1: us ob follow-up · 14 of 47 slices shown]
[im 2/47]
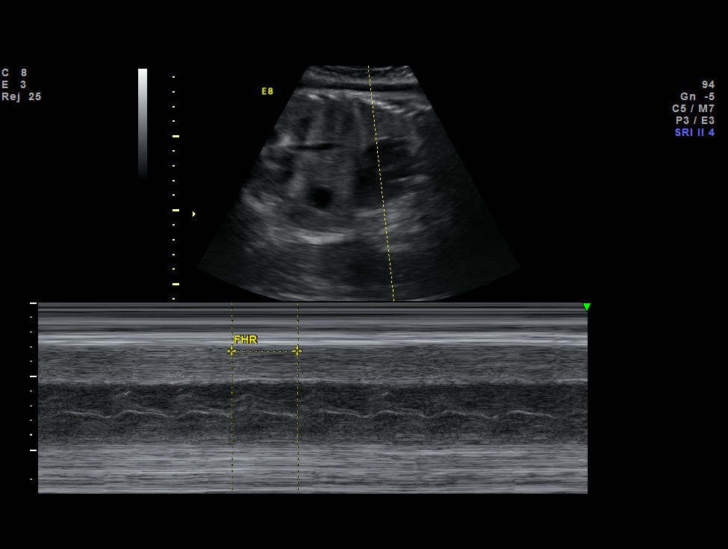
[im 6/47]
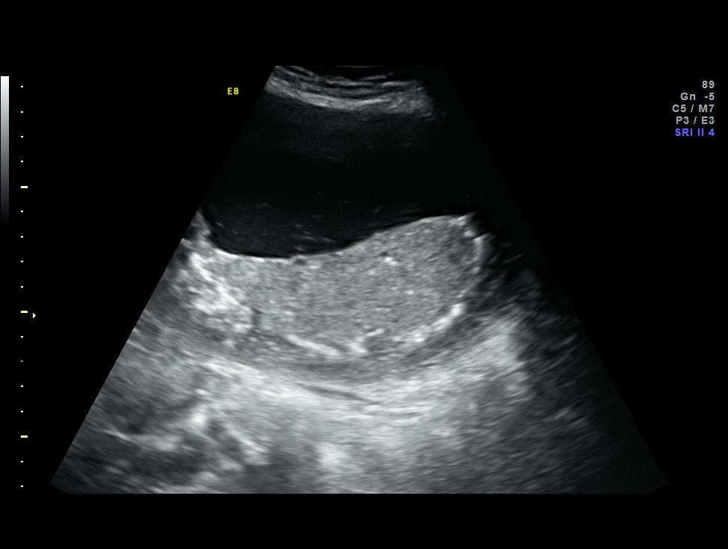
[im 9/47]
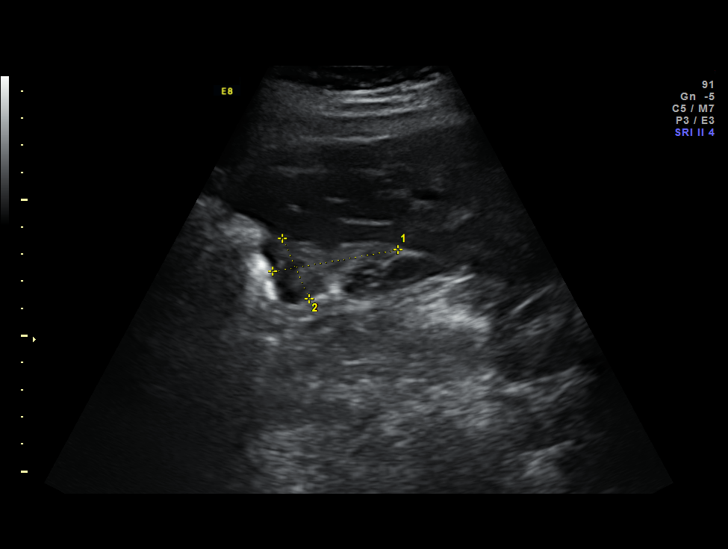
[im 12/47]
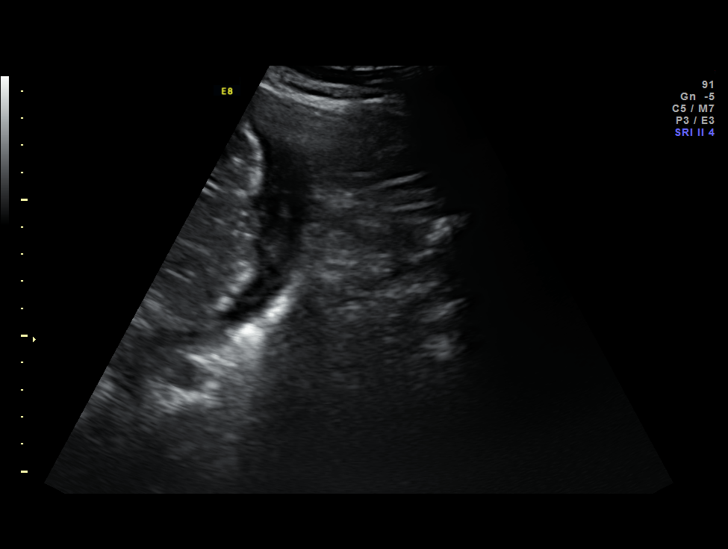
[im 16/47]
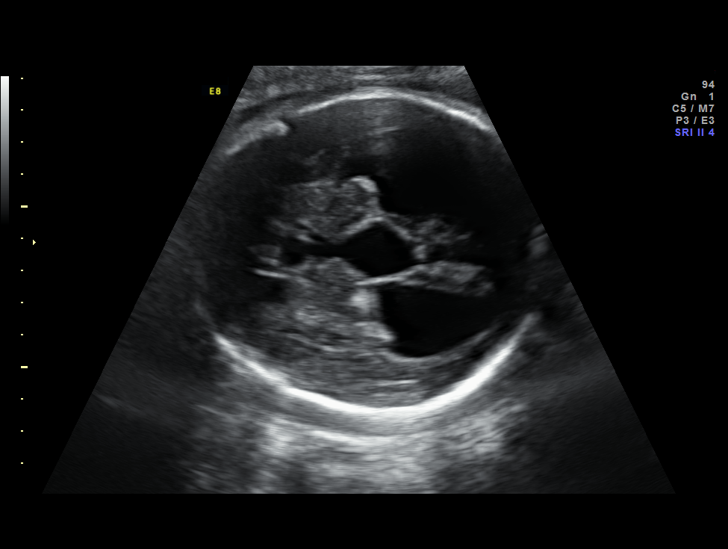
[im 19/47]
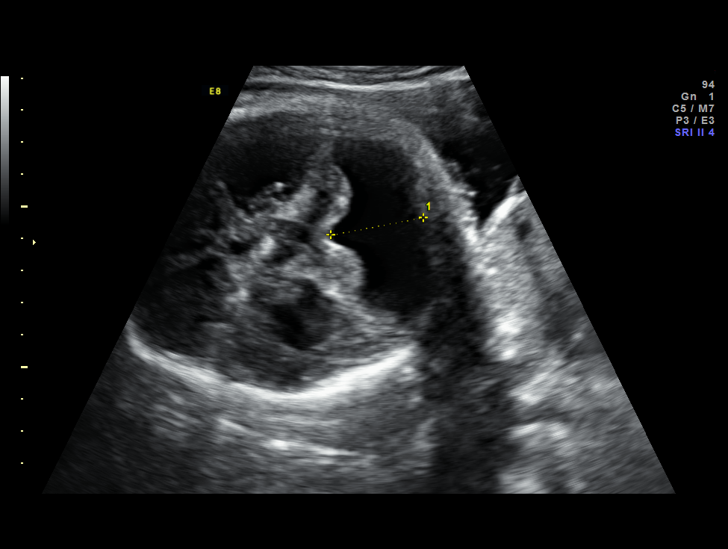
[im 23/47]
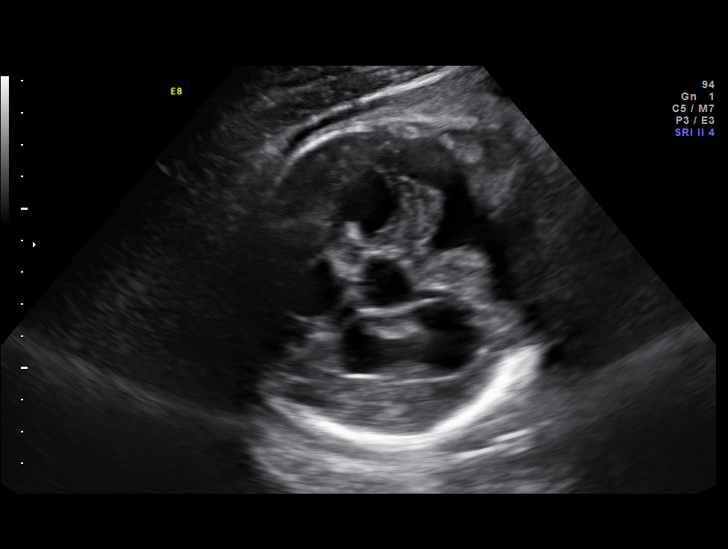
[im 26/47]
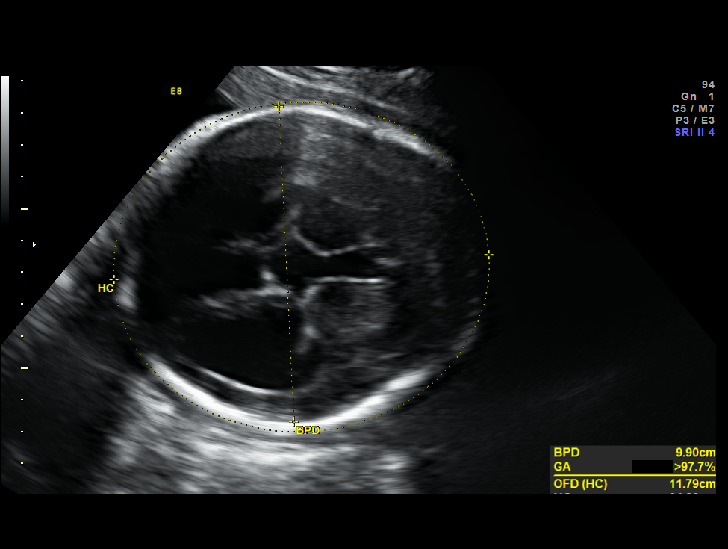
[im 29/47]
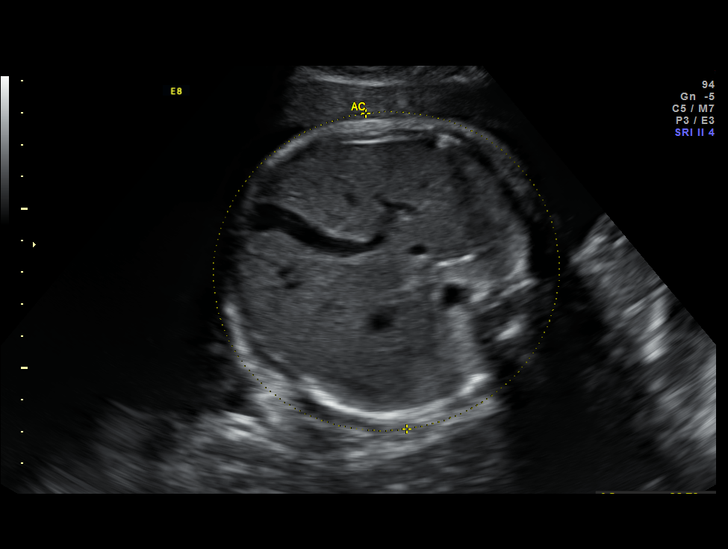
[im 33/47]
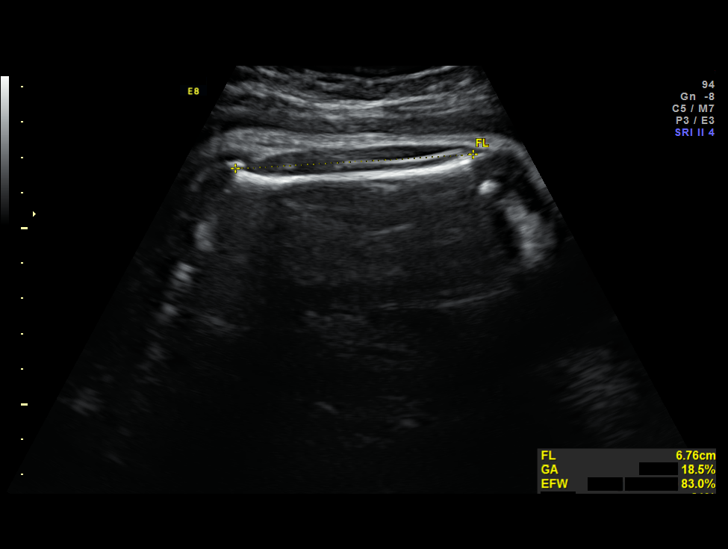
[im 36/47]
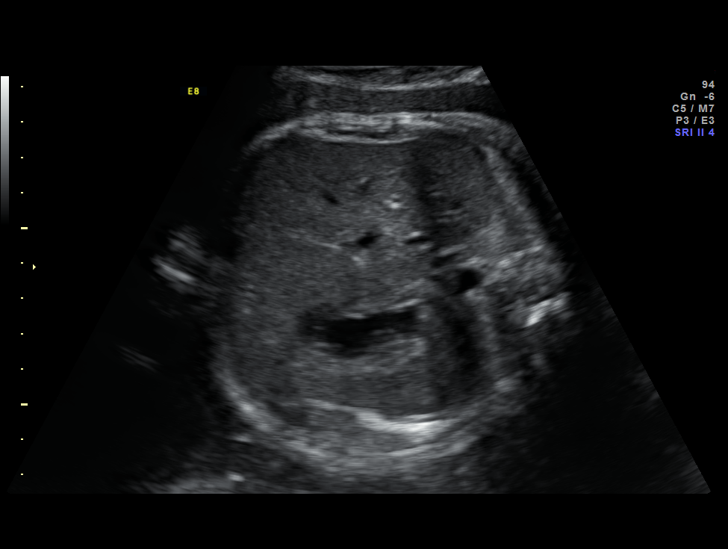
[im 40/47]
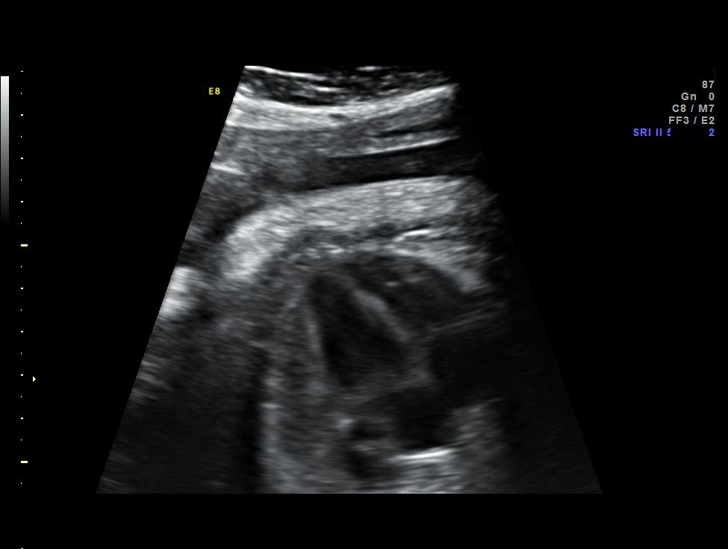
[im 43/47]
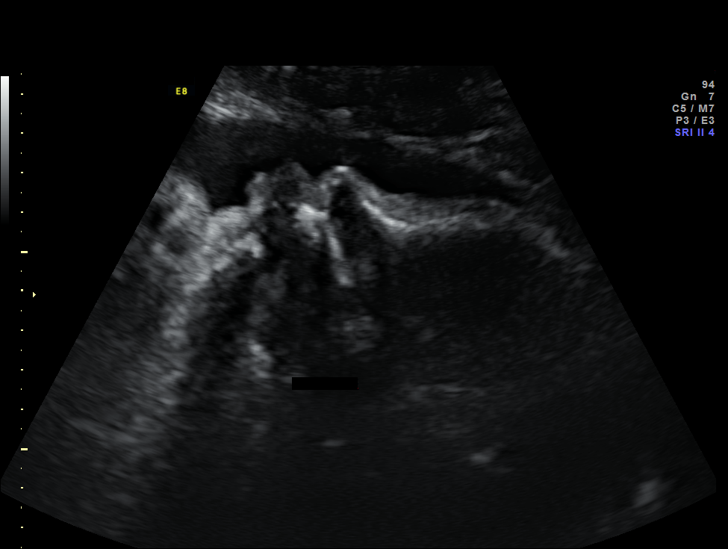
[im 47/47]
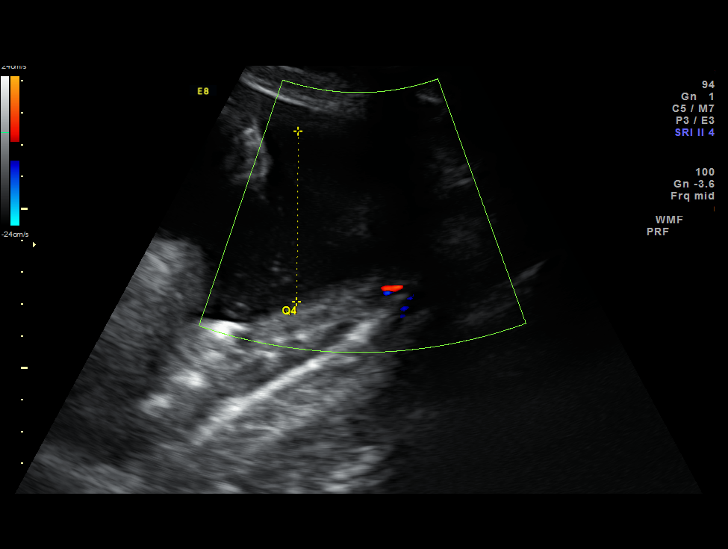

[14 of 28 positions shown; findings below may reference images not displayed]

Canned report from images found in remote index.

Refer to host system for actual result text.

## 2013-10-11 IMAGING — US US OB FOLLOW-UP
1 series · 12 of 28 positions shown · non-contrast
Comparison: none

OBSTETRICS REPORT
                      (Signed Final 11/19/2011 [DATE])

Service(s) Provided
 US OB FOLLOW UP                                       76816.1
Indications
 History of genetic / anatomic abnormality -
 previous child with CPT II deficiency
 Previous cesarean section
Fetal Evaluation
 Num Of Fetuses:    1
 Fetal Heart Rate:  161                          bpm
 Cardiac Activity:  Observed
 Presentation:      Cephalic
 Placenta:          Posterior, above cervical
                    os
 P. Cord            Previously Visualized
 Insertion:
 Amniotic Fluid
 AFI FV:      Subjectively within normal limits
                                             Larg Pckt:     4.1  cm
Biometry
 BPD:     43.4  mm     G. Age:  19w 1d                CI:         71.6   70 - 86
 OFD:     60.6  mm                                    FL/HC:      18.5   16.8 -
 HC:     167.4  mm     G. Age:  19w 3d       18  %    HC/AC:      1.17   1.09 -
 AC:     143.6  mm     G. Age:  19w 5d       35  %    FL/BPD:
 FL:        31  mm     G. Age:  19w 4d       29  %    FL/AC:      21.6   20 - 24
 HUM:     28.9  mm     G. Age:  19w 3d       38  %
 Est. FW:     303  gm    0 lb 11 oz      42  %
Gestational Age
 LMP:           20w 0d        Date:  07/02/11                 EDD:   04/07/12
 U/S Today:     19w 3d                                        EDD:   04/11/12
 Best:          20w 0d     Det. By:  LMP  (07/02/11)          EDD:   04/07/12
Anatomy
 Cranium:          Appears normal         Aortic Arch:      Appears normal
 Fetal Cavum:      Appears normal         Ductal Arch:      Appears normal
 Ventricles:       Appears normal         Diaphragm:        Appears normal
 Choroid Plexus:   Appears normal         Stomach:          Appears normal, left
                                                            sided
 Cerebellum:       Previously seen        Abdomen:          Appears normal
 Posterior Fossa:  Appears normal         Abdominal Wall:   Appears nml (cord
                                                            insert, abd wall)
 Nuchal Fold:      Not applicable (>20    Cord Vessels:     Appears normal (3
                   wks GA)                                  vessel cord)
 Face:             Appears normal         Kidneys:          Appear normal
                   (orbits and profile)
 Lips:             Appears normal         Bladder:          Appears normal
 Palate:           Appears normal         Spine:            Appears normal
 Heart:            Appears normal         Lower             Appears normal
                   (4CH, axis, and        Extremities:
                   situs)
 RVOT:             Appears normal         Upper             Appears normal
                                          Extremities:
 LVOT:             Appears normal
 Other:  Heels and 5th digit previously seen. Fetus appears to be a female.
Cervix Uterus Adnexa
 Cervical Length:    4        cm
 Cervix:       Normal appearance by transabdominal scan. Appears
               closed, without funnelling.
 Left Ovary:    Not visualized. No adnexal mass visualized.
 Right Ovary:   Within normal limits.
Impression
INDICATION: 25 yr old [id number] at 71w1d with previous child
 who died from CPT II deficiency for fetal anatomic survey.

[Series 1: us ob follow-up · 0.21mm/px · 12 of 100 slices shown]
[im 4/100]
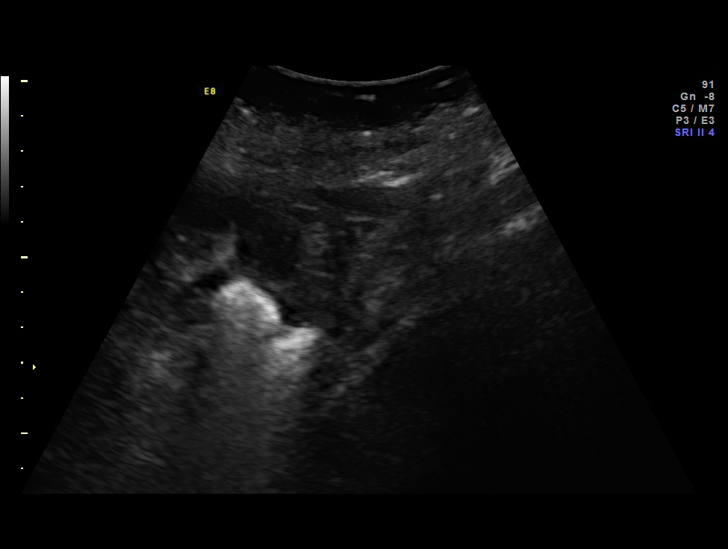
[im 12/100]
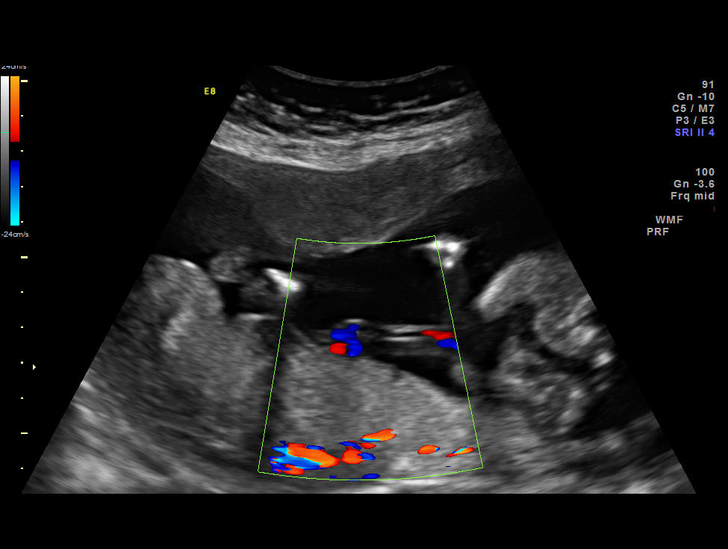
[im 19/100]
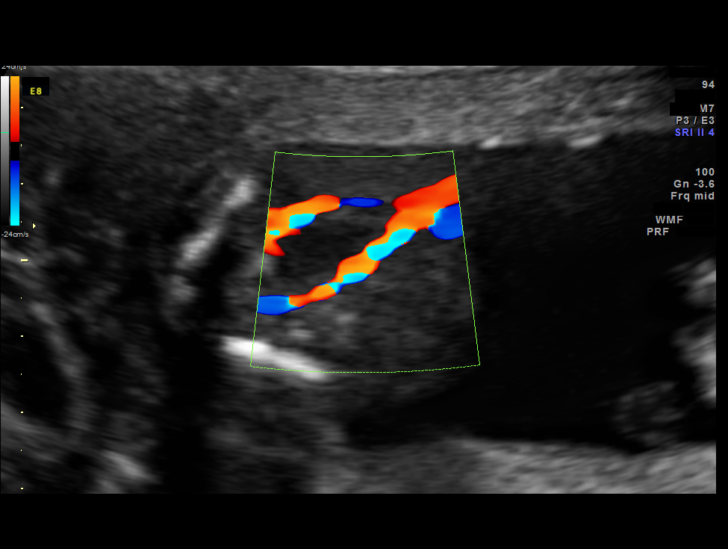
[im 30/100]
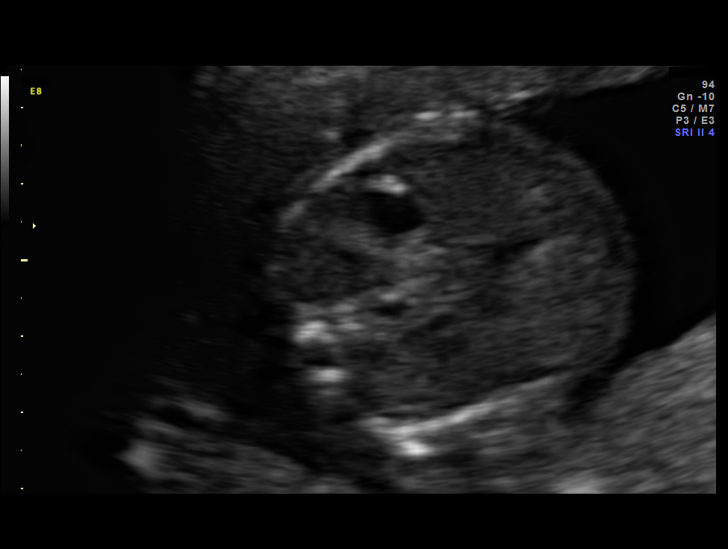
[im 37/100]
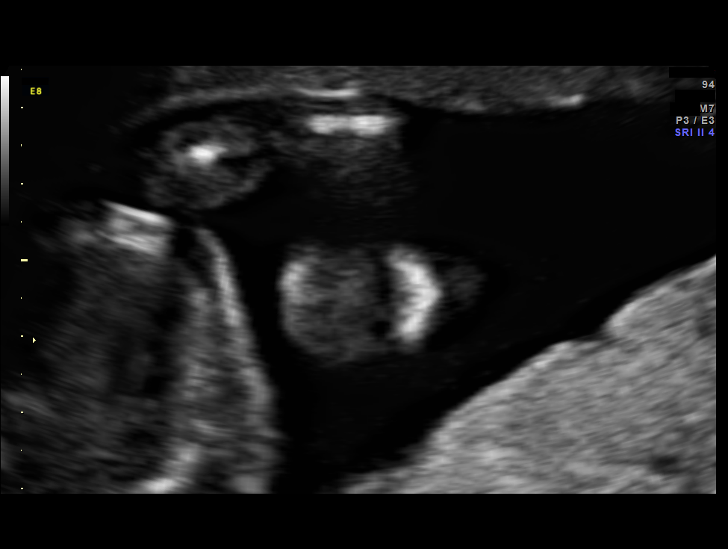
[im 45/100]
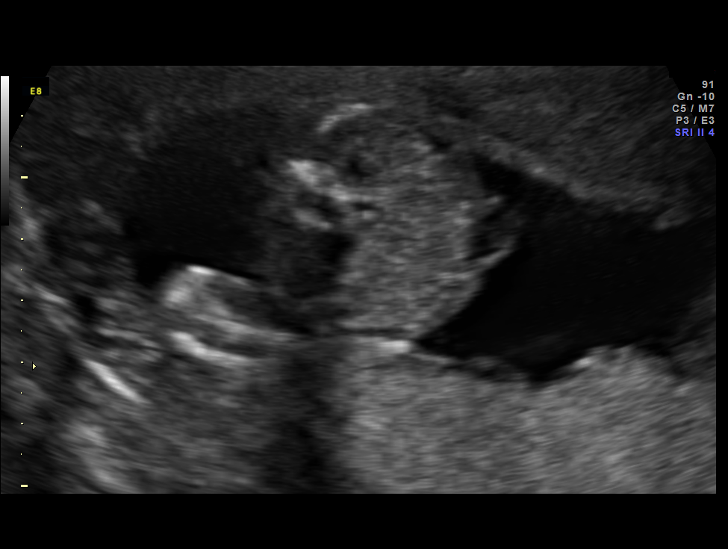
[im 56/100]
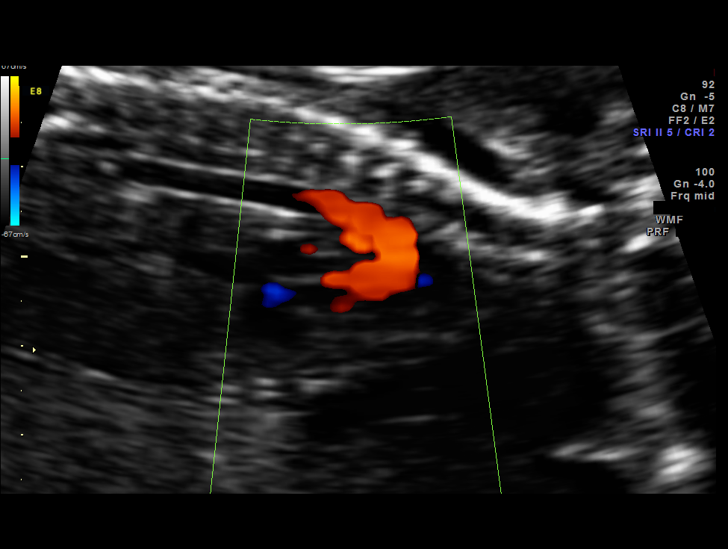
[im 63/100]
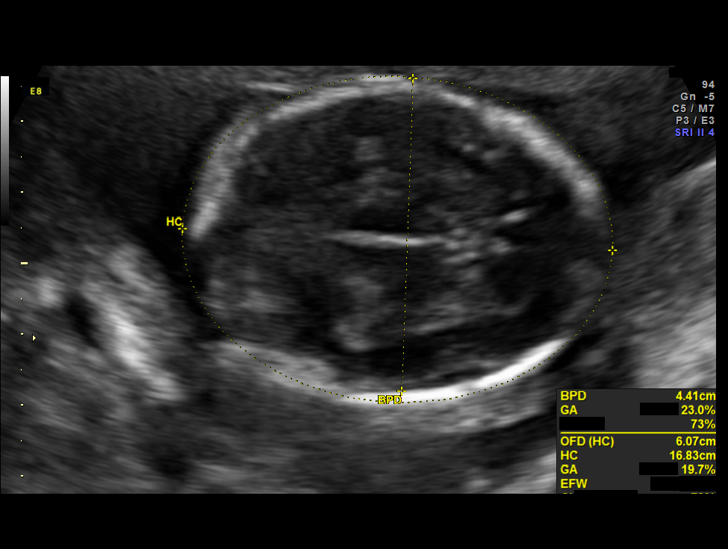
[im 70/100]
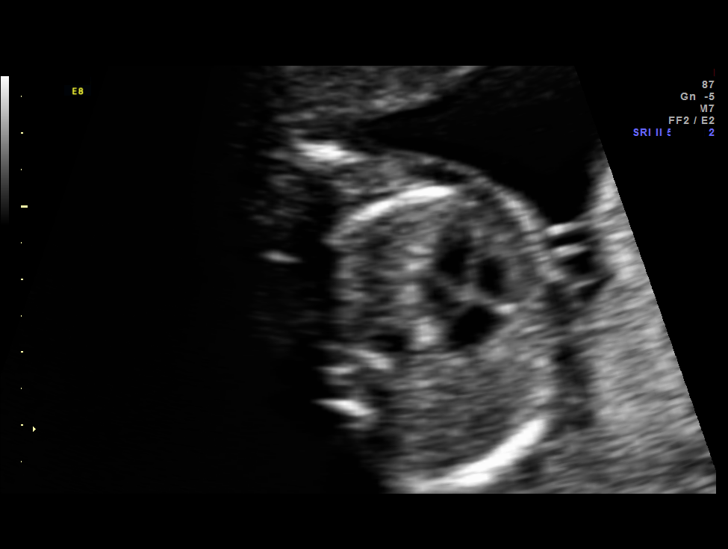
[im 81/100]
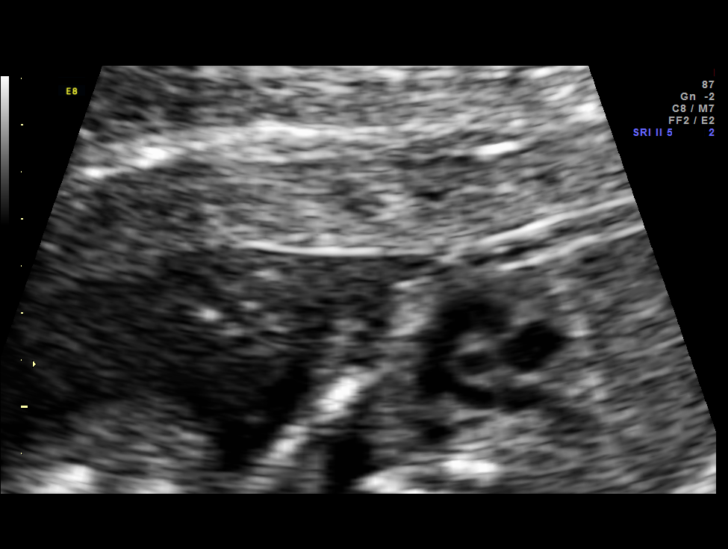
[im 89/100]
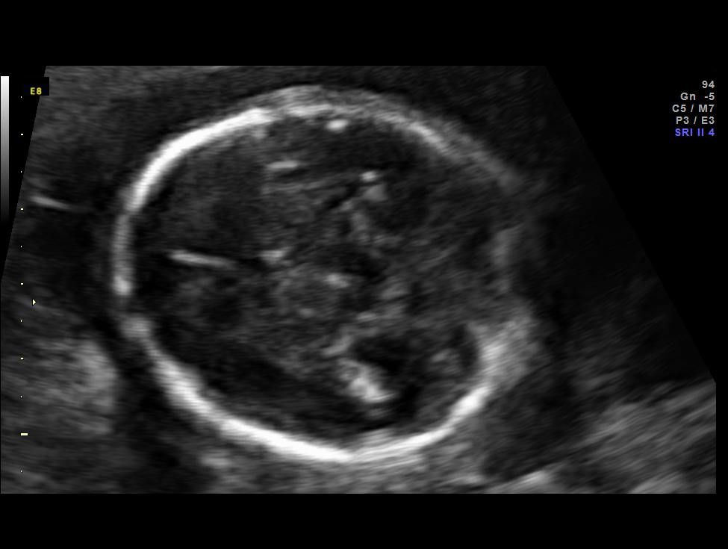
[im 96/100]
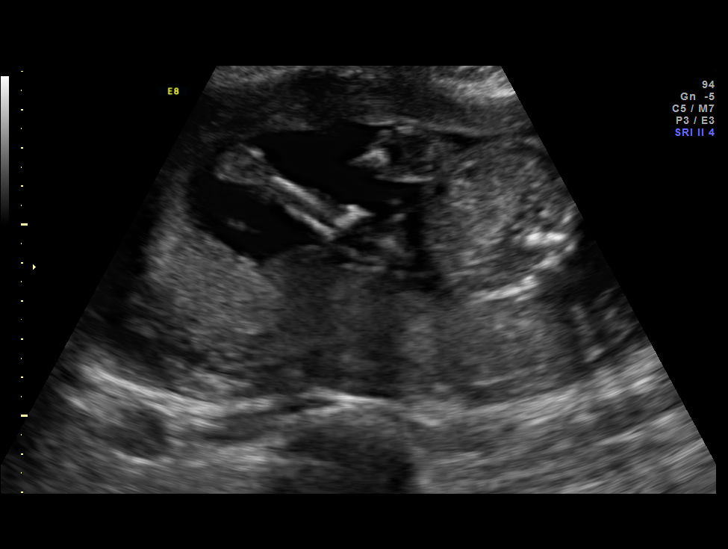

[12 of 28 positions shown; findings below may reference images not displayed]

FINDINGS: 1. Single intrauterine pregnancy.
 2. Fetal biometry is consistent with dating.
 3. Posterior placenta without evidence of previa.
 4. Normal amniotic fluid volume.
 5. Normla transabdominal cervical length.
 6. Normal fetal anatomic survey. The posterior fossa was not
 well visualized on today's exam but was evaluated  on the
 previous exam.
Recommendations

 1. Appropriate fetal growth.
 2. Normal fetal anatomic survey.
 3. Previous child with CPT II deficiency:
 - previously counseled
 - had amniocentesis which showed normal karyotype and this
 fetus is a carrier (so clinically unaffected) of CPT II deficiency
 4. Recommend follow up as clinically indicated.

 questions or concerns.

## 2013-11-06 ENCOUNTER — Encounter (HOSPITAL_COMMUNITY): Payer: Self-pay | Admitting: Obstetrics and Gynecology

## 2014-08-14 ENCOUNTER — Encounter: Payer: 59 | Attending: Dietician | Admitting: Dietician

## 2014-08-14 VITALS — Ht 68.0 in | Wt 218.5 lb

## 2014-08-14 DIAGNOSIS — Z713 Dietary counseling and surveillance: Secondary | ICD-10-CM | POA: Diagnosis not present

## 2014-08-14 DIAGNOSIS — E669 Obesity, unspecified: Secondary | ICD-10-CM

## 2014-08-14 NOTE — Patient Instructions (Signed)
Estimate carbohydrate servings spreading 10-11 servings over the day. Measure some portions especially the starchy foods, counting most of the starches as 1 serving for 1/2 cup. Increase water. Will not make any more Koolaide. To eat fruit with meal in place of fries. Try more steamed vegetables.

## 2014-08-14 NOTE — Progress Notes (Signed)
Notes from [facility] employee "self referral" nutrition session: Start time: 13:30   End time 14:15  Met with employee to discuss his/her nutritional concerns and diet history. The employee's questions/concerns were also addressed.  We discussed the following topics:  Healthy Eating  Exercise  I also provided the following handouts as reinforcement of the educational session:  Planning a Balanced Meal  Food Guide Plate  Additional Comments: Instructed on identifying carbohydrate foods and instructed on how to better balance carbohydrate, protein and non-starchy vegetables. Instructed on portion control. Discussed ways to decrease fat in diet.  Goals Agreed Upon: Estimate carbohydrate servings spreading 10-11 servings over the day. Measure some portions especially the starchy foods, counting most of the starches as 1 serving for 1/2 cup. Increase water. Will not make any more Koolaide. To eat fruit with meal in place of fries. Try more steamed vegetables.

## 2014-11-09 ENCOUNTER — Ambulatory Visit
Admission: RE | Admit: 2014-11-09 | Discharge: 2014-11-09 | Disposition: A | Discharge status: Home / Self Care | Payer: PRIVATE HEALTH INSURANCE | Source: Ambulatory Visit | Attending: Physician Assistant | Admitting: Physician Assistant

## 2014-11-09 ENCOUNTER — Encounter: Payer: Self-pay | Admitting: Physician Assistant

## 2014-11-09 ENCOUNTER — Ambulatory Visit: Payer: Self-pay | Admitting: Physician Assistant

## 2014-11-09 VITALS — BP 120/75 | HR 111 | Temp 98.5°F

## 2014-11-09 DIAGNOSIS — M25551 Pain in right hip: Secondary | ICD-10-CM

## 2014-11-09 MED ORDER — CYCLOBENZAPRINE HCL 10 MG PO TABS: 10.0000 mg | ORAL_TABLET | Freq: Three times a day (TID) | ORAL | Status: DC | PRN

## 2014-11-09 MED ORDER — METHYLPREDNISOLONE 4 MG PO TBPK: ORAL_TABLET | ORAL | Status: DC

## 2014-11-09 NOTE — Progress Notes (Signed)
S: c/o r hip pain, no fever/chills/or known injury, increased pain with movement of hip, standing on hip, no swelling in leg, denies back pain or knee pain, no numbness or tingling except last night leg felt like dead weight;  has bc implant, family hx of dm and oa;   O: vitals wnl except pulse elevated, spine nontender, knee nontender, r hip tender at joint and adjacent to pubic bone, pain reproduced with all intentional motion, reproduced with passive internal and external rotation , n/v intact  A: r hip pain  P: xray r hip, medrol dose pack, flexeril 10mg  tid #30 nr, ice, crutches given in clinic

## 2014-11-19 ENCOUNTER — Ambulatory Visit (INDEPENDENT_AMBULATORY_CARE_PROVIDER_SITE_OTHER): Payer: PRIVATE HEALTH INSURANCE | Admitting: Family Medicine

## 2014-11-19 VITALS — BP 130/80 | HR 96 | Ht 68.0 in | Wt 225.4 lb

## 2014-11-19 DIAGNOSIS — Z833 Family history of diabetes mellitus: Secondary | ICD-10-CM | POA: Insufficient documentation

## 2014-11-19 DIAGNOSIS — L700 Acne vulgaris: Secondary | ICD-10-CM | POA: Diagnosis not present

## 2014-11-19 DIAGNOSIS — E669 Obesity, unspecified: Secondary | ICD-10-CM | POA: Insufficient documentation

## 2014-11-19 DIAGNOSIS — E559 Vitamin D deficiency, unspecified: Secondary | ICD-10-CM

## 2014-11-19 DIAGNOSIS — M25551 Pain in right hip: Secondary | ICD-10-CM

## 2014-11-20 ENCOUNTER — Encounter: Payer: Self-pay | Admitting: Family Medicine

## 2014-11-20 DIAGNOSIS — E559 Vitamin D deficiency, unspecified: Secondary | ICD-10-CM | POA: Insufficient documentation

## 2014-11-20 DIAGNOSIS — M25551 Pain in right hip: Secondary | ICD-10-CM | POA: Insufficient documentation

## 2014-11-20 LAB — COMPREHENSIVE METABOLIC PANEL
A/G RATIO: 1.4 (ref 1.1–2.5)
ALT: 8 IU/L (ref 0–32)
AST: 12 IU/L (ref 0–40)
Albumin: 4.1 g/dL (ref 3.5–5.5)
Alkaline Phosphatase: 87 IU/L (ref 39–117)
BILIRUBIN TOTAL: 0.3 mg/dL (ref 0.0–1.2)
BUN/Creatinine Ratio: 15 (ref 8–20)
BUN: 11 mg/dL (ref 6–20)
CHLORIDE: 101 mmol/L (ref 97–106)
CO2: 26 mmol/L (ref 18–29)
Calcium: 9.1 mg/dL (ref 8.7–10.2)
Creatinine, Ser: 0.71 mg/dL (ref 0.57–1.00)
GFR calc non Af Amer: 117 mL/min/{1.73_m2} (ref >59)
GFR, EST AFRICAN AMERICAN: 135 mL/min/{1.73_m2} (ref >59)
Globulin, Total: 3 g/dL (ref 1.5–4.5)
Glucose: 96 mg/dL (ref 65–99)
POTASSIUM: 3.9 mmol/L (ref 3.5–5.2)
Sodium: 142 mmol/L (ref 136–144)
TOTAL PROTEIN: 7.1 g/dL (ref 6.0–8.5)

## 2014-11-20 LAB — CBC
Hematocrit: 35.5 % (ref 34.0–46.6)
Hemoglobin: 11.7 g/dL (ref 11.1–15.9)
MCH: 27.4 pg (ref 26.6–33.0)
MCHC: 33 g/dL (ref 31.5–35.7)
MCV: 83 fL (ref 79–97)
PLATELETS: 367 10*3/uL (ref 150–379)
RBC: 4.27 x10E6/uL (ref 3.77–5.28)
RDW: 14 % (ref 12.3–15.4)
WBC: 8.8 10*3/uL (ref 3.4–10.8)

## 2014-11-20 LAB — LIPID PANEL
Chol/HDL Ratio: 2.7 ratio units (ref 0.0–4.4)
Cholesterol, Total: 131 mg/dL (ref 100–199)
HDL: 49 mg/dL (ref >39)
LDL Calculated: 65 mg/dL (ref 0–99)
Triglycerides: 87 mg/dL (ref 0–149)
VLDL Cholesterol Cal: 17 mg/dL (ref 5–40)

## 2014-11-20 LAB — HEMOGLOBIN A1C
Est. average glucose Bld gHb Est-mCnc: 114 mg/dL
Hgb A1c MFr Bld: 5.6 % (ref 4.8–5.6)

## 2014-11-20 LAB — TSH: TSH: 0.583 u[IU]/mL (ref 0.450–4.500)

## 2014-11-20 LAB — VITAMIN D 25 HYDROXY (VIT D DEFICIENCY, FRACTURES): Vit D, 25-Hydroxy: 17.6 ng/mL — ABNORMAL LOW (ref 30.0–100.0)

## 2014-11-20 MED ORDER — VITAMIN D 50 MCG (2000 UT) PO CAPS: 1.0000 | ORAL_CAPSULE | Freq: Every day | ORAL | Status: DC
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
## 2014-11-20 NOTE — Addendum Note (Signed)
Addended by: Schuyler AmorPLONK, Clarence Cogswell on: 11/20/2014 10:31 AM   Modules accepted: Orders

## 2014-11-22 NOTE — Progress Notes (Signed)
Do not refill medrol dose pack .Alyssa Maximiano CossAnne Moore FNP

## 2014-11-27 ENCOUNTER — Encounter: Payer: Self-pay | Admitting: Physician Assistant

## 2014-11-27 ENCOUNTER — Ambulatory Visit: Payer: Self-pay | Admitting: Physician Assistant

## 2014-11-27 VITALS — BP 110/70 | HR 121 | Temp 99.3°F

## 2014-11-27 DIAGNOSIS — J020 Streptococcal pharyngitis: Secondary | ICD-10-CM

## 2014-11-27 LAB — POCT RAPID STREP A (OFFICE): RAPID STREP A SCREEN: POSITIVE — AB

## 2014-11-27 MED ORDER — AMOXICILLIN 875 MG PO TABS: 875.0000 mg | ORAL_TABLET | Freq: Two times a day (BID) | ORAL | Status: DC

## 2014-11-27 NOTE — Progress Notes (Signed)
S: c/o fever, sore throat and body aches, no cough or congestion, sx for 1 day, coworker had strep  O: vitals w elevated temp and hr; tms clear, throat red and swollen, neck supple no lymph, lungs c t a, cv rrr, q strep +  A: acute strep throat  P: amoxil 875mg  bid x 10d, tylenol or motrin for fever as needed

## 2014-12-19 ENCOUNTER — Ambulatory Visit: Payer: PRIVATE HEALTH INSURANCE | Admitting: Family Medicine

## 2015-05-28 ENCOUNTER — Encounter: Payer: Self-pay | Admitting: Physician Assistant

## 2015-05-28 ENCOUNTER — Ambulatory Visit: Payer: Self-pay | Admitting: Physician Assistant

## 2015-05-28 VITALS — BP 120/80 | HR 94 | Temp 98.3°F

## 2015-05-28 DIAGNOSIS — L259 Unspecified contact dermatitis, unspecified cause: Secondary | ICD-10-CM

## 2015-05-28 MED ORDER — DEXAMETHASONE SODIUM PHOSPHATE 10 MG/ML IJ SOLN
10.0000 mg | Freq: Once | INTRAMUSCULAR | Status: AC
  Administered 2015-05-28: 10 mg via INTRAMUSCULAR

## 2015-05-28 NOTE — Progress Notes (Signed)
S: c/o itchy rash on arms, legs, husband was outside in yard and then broke out, now she has some areas, sx for few days, tried multiple otc meds without relief, denies fever/chills  O: vitals wnl, nad, lungs c t a, cv rrr, skin with small raised red areas on back of arms, no drainage, n/v intact  A: acute contact dermatitis  P: decadron 10 mg IM

## 2015-08-22 ENCOUNTER — Ambulatory Visit: Payer: Self-pay | Admitting: Physician Assistant

## 2015-08-22 ENCOUNTER — Encounter: Payer: Self-pay | Admitting: Physician Assistant

## 2015-08-22 VITALS — BP 110/70 | HR 94 | Temp 98.3°F

## 2015-08-22 DIAGNOSIS — J069 Acute upper respiratory infection, unspecified: Secondary | ICD-10-CM

## 2015-08-22 MED ORDER — BENZONATATE 200 MG PO CAPS: 200.0000 mg | ORAL_CAPSULE | Freq: Three times a day (TID) | ORAL | 0 refills | Status: DC | PRN

## 2015-08-22 MED ORDER — AZITHROMYCIN 250 MG PO TABS: ORAL_TABLET | ORAL | 0 refills | Status: DC

## 2015-08-22 NOTE — Progress Notes (Signed)
S: C/o runny nose and congestion for 3 days, no fever, chills, cp/sob, v/d; mucus was green this am but clear throughout the day, cough is sporadic, daughter has pneumonia  Using otc meds: robitussin  O: PE: vitals wnl, nad,  perrl eomi, normocephalic, tms dull, nasal mucosa red and swollen, throat injected, neck supple no lymph, lungs c t a, cv rrr, neuro intact  A:  Acute uri   P: drink fluids, continue regular meds , use otc meds of choice, return if not improving in 5 days, return earlier if worsening , zpack, tessalon perls

## 2015-11-26 ENCOUNTER — Ambulatory Visit: Payer: Self-pay | Admitting: Physician Assistant

## 2015-11-26 ENCOUNTER — Encounter: Payer: Self-pay | Admitting: Physician Assistant

## 2015-11-26 VITALS — BP 137/88 | HR 101 | Temp 99.4°F

## 2015-11-26 DIAGNOSIS — J069 Acute upper respiratory infection, unspecified: Secondary | ICD-10-CM

## 2015-11-26 MED ORDER — BENZONATATE 200 MG PO CAPS: 200.0000 mg | ORAL_CAPSULE | Freq: Three times a day (TID) | ORAL | 0 refills | Status: DC | PRN

## 2015-11-26 MED ORDER — AZITHROMYCIN 250 MG PO TABS: ORAL_TABLET | ORAL | 0 refills | Status: DC

## 2015-11-26 NOTE — Progress Notes (Signed)
S: C/o runny nose and congestion for 3 days, no fever, chills, cp/sob, v/d; mucus is green and thick, cough is sporadic, c/o of facial and dental pain. Family members have same, no body aches  Using otc meds:   O: PE: low grade temp at 99.4, other vitals wnl, nad, perrl eomi, normocephalic, tms dull, nasal mucosa red and swollen, throat injected, neck supple no lymph, lungs c t a, cv rrr, neuro intact  A:  Acute uri   P: drink fluids, continue regular meds , use otc meds of choice, return if not improving in 5 days, return earlier if worsening , zpack, tessalon

## 2015-11-30 ENCOUNTER — Other Ambulatory Visit: Payer: Self-pay

## 2015-11-30 ENCOUNTER — Emergency Department
Admission: EM | Admit: 2015-11-30 | Discharge: 2015-11-30 | Disposition: A | Discharge status: Home / Self Care | Payer: Managed Care, Other (non HMO) | Attending: Emergency Medicine | Admitting: Emergency Medicine

## 2015-11-30 ENCOUNTER — Encounter: Payer: Self-pay | Admitting: Emergency Medicine

## 2015-11-30 DIAGNOSIS — Z79899 Other long term (current) drug therapy: Secondary | ICD-10-CM | POA: Diagnosis not present

## 2015-11-30 DIAGNOSIS — F17290 Nicotine dependence, other tobacco product, uncomplicated: Secondary | ICD-10-CM | POA: Diagnosis not present

## 2015-11-30 DIAGNOSIS — R112 Nausea with vomiting, unspecified: Secondary | ICD-10-CM | POA: Diagnosis present

## 2015-11-30 DIAGNOSIS — K529 Noninfective gastroenteritis and colitis, unspecified: Secondary | ICD-10-CM | POA: Insufficient documentation

## 2015-11-30 LAB — COMPREHENSIVE METABOLIC PANEL
ALK PHOS: 84 U/L (ref 38–126)
ALT: 11 U/L — AB (ref 14–54)
AST: 18 U/L (ref 15–41)
Albumin: 4 g/dL (ref 3.5–5.0)
Anion gap: 11 (ref 5–15)
BUN: 17 mg/dL (ref 6–20)
CALCIUM: 8.7 mg/dL — AB (ref 8.9–10.3)
CO2: 22 mmol/L (ref 22–32)
CREATININE: 0.79 mg/dL (ref 0.44–1.00)
Chloride: 108 mmol/L (ref 101–111)
Glucose, Bld: 113 mg/dL — ABNORMAL HIGH (ref 65–99)
Potassium: 3.3 mmol/L — ABNORMAL LOW (ref 3.5–5.1)
Sodium: 141 mmol/L (ref 135–145)
Total Bilirubin: 0.6 mg/dL (ref 0.3–1.2)
Total Protein: 8.7 g/dL — ABNORMAL HIGH (ref 6.5–8.1)

## 2015-11-30 LAB — URINALYSIS COMPLETE WITH MICROSCOPIC (ARMC ONLY)
Bilirubin Urine: NEGATIVE
Glucose, UA: NEGATIVE mg/dL
KETONES UR: NEGATIVE mg/dL
Nitrite: NEGATIVE
PROTEIN: 30 mg/dL — AB
Specific Gravity, Urine: 1.034 — ABNORMAL HIGH (ref 1.005–1.030)
pH: 5 (ref 5.0–8.0)

## 2015-11-30 LAB — CBC
HCT: 39.9 % (ref 35.0–47.0)
Hemoglobin: 13.2 g/dL (ref 12.0–16.0)
MCH: 28 pg (ref 26.0–34.0)
MCHC: 33 g/dL (ref 32.0–36.0)
MCV: 84.8 fL (ref 80.0–100.0)
PLATELETS: 291 10*3/uL (ref 150–440)
RBC: 4.71 MIL/uL (ref 3.80–5.20)
RDW: 13.9 % (ref 11.5–14.5)
WBC: 8.2 10*3/uL (ref 3.6–11.0)

## 2015-11-30 LAB — LIPASE, BLOOD: Lipase: 13 U/L (ref 11–51)

## 2015-11-30 LAB — POCT PREGNANCY, URINE: PREG TEST UR: NEGATIVE

## 2015-11-30 MED ORDER — ONDANSETRON HCL 4 MG PO TABS: 4.0000 mg | ORAL_TABLET | Freq: Every day | ORAL | 1 refills | Status: DC | PRN

## 2015-11-30 MED ORDER — ONDANSETRON HCL 4 MG/2ML IJ SOLN
4.0000 mg | Freq: Once | INTRAMUSCULAR | Status: AC | PRN
  Administered 2015-11-30: 4 mg via INTRAVENOUS
  Filled 2015-11-30: qty 2

## 2015-11-30 MED ORDER — SODIUM CHLORIDE 0.9 % IV BOLUS (SEPSIS)
1000.0000 mL | Freq: Once | INTRAVENOUS | Status: AC
  Administered 2015-11-30: 1000 mL via INTRAVENOUS

## 2015-11-30 MED ORDER — SODIUM CHLORIDE 0.9 % IV SOLN
Freq: Once | INTRAVENOUS | Status: AC
  Administered 2015-11-30: 20:00:00 via INTRAVENOUS

## 2015-11-30 NOTE — ED Notes (Signed)
Active vomiting in triage 800 cc output.  Pt ate chick fila for lunch

## 2015-11-30 NOTE — ED Notes (Signed)
Pt is in good condition; discharge instructions reviewed, follow up care, home care reviewed; prescription medication reviewed; pt verbalized understanding; pt is ambulatory and left with family

## 2015-11-30 NOTE — ED Triage Notes (Signed)
Pt presents to ED with family with c/o abdominal pain that started yesterday and nausea, vomiting and diarrhea that started on Tuesday. Finished Z-pack for URI today, Pt has had 3 episodes of vomiting and multiple episodes of diarrhea.  Pt reports chills and fever at home. Pt states temp was over 100 today at home prior to arrival.  Aleve taken at home around 0900 this AM. Denies any blood in stool.

## 2015-11-30 NOTE — ED Notes (Signed)
Patient to ED for N/V/D since Thursday. States she has had a few bouts of diarrhea and her lower abdomen has been cramping. Currently is feeling much better after one liter of NS. States her daughter had similar symptoms and she feels like they might be sharing same illness. Skin warm/dry, mucous membranes moist.

## 2015-11-30 NOTE — ED Notes (Signed)
Patient ambulatory to bathroom across the hall from room without assistance. Second bag of NS started per verbal order from MD. Patient is tolerating fluids well. Denies need for anything at this time. Will continue to monitor.
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
## 2015-12-12 ENCOUNTER — Encounter: Payer: Self-pay | Admitting: Physician Assistant

## 2015-12-12 ENCOUNTER — Ambulatory Visit: Payer: Self-pay | Admitting: Physician Assistant

## 2015-12-12 VITALS — BP 110/70 | HR 88 | Temp 98.5°F

## 2015-12-12 DIAGNOSIS — N390 Urinary tract infection, site not specified: Secondary | ICD-10-CM

## 2015-12-12 DIAGNOSIS — R319 Hematuria, unspecified: Secondary | ICD-10-CM

## 2015-12-12 DIAGNOSIS — Z299 Encounter for prophylactic measures, unspecified: Secondary | ICD-10-CM

## 2015-12-12 LAB — POCT URINALYSIS DIPSTICK
LEUKOCYTES UA: NEGATIVE
NITRITE UA: POSITIVE
PH UA: 5
Spec Grav, UA: 1.025
Urobilinogen, UA: 1

## 2015-12-12 MED ORDER — NITROFURANTOIN MONOHYD MACRO 100 MG PO CAPS: 100.0000 mg | ORAL_CAPSULE | Freq: Two times a day (BID) | ORAL | 0 refills | Status: DC

## 2015-12-12 NOTE — Progress Notes (Signed)
S:  C/o uti sx for 2 days, burning, urgency, frequency, denies vaginal discharge, abdominal pain or flank pain:  Remainder ros neg  O:  Vitals wnl, nad, no cva tenderness, back nontender, lungs c t a,cv rrr, abd soft nontender, bs normal, n/v intact, ua + nitrites  A: uti  P: macrobid 100mg   bid x 7d, increase water intake, add cranberry juice, return if not improving in 2 -3 days, return earlier if worsening, discussed pyelonephritis sx

## 2015-12-15 LAB — URINE CULTURE

## 2015-12-23 ENCOUNTER — Other Ambulatory Visit: Payer: Self-pay

## 2015-12-23 DIAGNOSIS — Z299 Encounter for prophylactic measures, unspecified: Secondary | ICD-10-CM

## 2015-12-23 LAB — POCT URINALYSIS DIPSTICK
Bilirubin, UA: NEGATIVE
Glucose, UA: NEGATIVE
Ketones, UA: NEGATIVE
LEUKOCYTES UA: NEGATIVE
NITRITE UA: NEGATIVE
PH UA: 5.5
PROTEIN UA: NEGATIVE
Spec Grav, UA: >=1.03
UROBILINOGEN UA: 1

## 2015-12-23 NOTE — Progress Notes (Signed)
Patient came in the recheck her urine per Susan's authorization.

## 2016-04-20 ENCOUNTER — Ambulatory Visit: Payer: Self-pay | Admitting: Physician Assistant

## 2016-04-20 ENCOUNTER — Encounter: Payer: Self-pay | Admitting: Physician Assistant

## 2016-04-20 VITALS — BP 110/70 | HR 85 | Temp 98.7°F

## 2016-04-20 DIAGNOSIS — H6981 Other specified disorders of Eustachian tube, right ear: Secondary | ICD-10-CM

## 2016-04-20 NOTE — Progress Notes (Signed)
S:  C/o ears pain on r side, her supervisor said it looked red, no drainage from ears, no fever/chills, no cough or congestion, some sinus pressure, remainder ros neg Using otc meds without relief  O:  Vitals wnl, nad, tms dull b/l, nasal mucosa swollen, throat wnl, neck supple no lymph, lungs c t a, cv rrr, neuro intact  A: acute eustachean tube dysfunction  P: flonase, sudafed, ibuprofen, return if not improving in 3 to 5 days, return earlier if worsening

## 2016-04-30 ENCOUNTER — Encounter: Payer: Self-pay | Admitting: Physician Assistant

## 2016-04-30 ENCOUNTER — Ambulatory Visit: Payer: Self-pay | Admitting: Physician Assistant

## 2016-04-30 VITALS — BP 115/80 | HR 105 | Temp 98.5°F

## 2016-04-30 DIAGNOSIS — R319 Hematuria, unspecified: Principal | ICD-10-CM

## 2016-04-30 DIAGNOSIS — N390 Urinary tract infection, site not specified: Secondary | ICD-10-CM

## 2016-04-30 LAB — POCT URINALYSIS DIPSTICK
BILIRUBIN UA: NEGATIVE
GLUCOSE UA: NEGATIVE
Ketones, UA: NEGATIVE
NITRITE UA: NEGATIVE
PH UA: 5.5 (ref 5.0–8.0)
Spec Grav, UA: 1.025 (ref 1.010–1.025)
Urobilinogen, UA: 0.2 E.U./dL

## 2016-04-30 MED ORDER — NITROFURANTOIN MONOHYD MACRO 100 MG PO CAPS: 100.0000 mg | ORAL_CAPSULE | Freq: Two times a day (BID) | ORAL | 0 refills | Status: DC

## 2016-04-30 NOTE — Progress Notes (Signed)
S:  C/o uti sx for 2 days, burning, urgency, frequency, denies vaginal discharge, abdominal pain or flank pain:  Remainder ros neg  O:  Vitals wnl, nad, no cva tenderness, back nontender, lungs c t a,cv rrr, abd soft nontender, bs normal, n/v intact  A: uti  P: macrobid 100 mg bid x 7d, increase water intake, add cranberry juice, return if not improving in 2 -3 days, return earlier if worsening, discussed pyelonephritis sx, urine culture

## 2016-05-02 LAB — URINE CULTURE

## 2016-12-21 ENCOUNTER — Ambulatory Visit: Payer: Self-pay

## 2017-01-04 ENCOUNTER — Encounter: Payer: Self-pay | Admitting: Emergency Medicine

## 2017-01-04 ENCOUNTER — Ambulatory Visit: Payer: Self-pay | Admitting: Emergency Medicine

## 2017-01-04 VITALS — BP 130/80 | HR 100 | Temp 98.4°F | Resp 16

## 2017-01-04 DIAGNOSIS — J209 Acute bronchitis, unspecified: Secondary | ICD-10-CM

## 2017-01-04 MED ORDER — FLUTICASONE PROPIONATE 50 MCG/ACT NA SUSP: 2.0000 | Freq: Every day | NASAL | 6 refills | Status: DC

## 2017-01-04 MED ORDER — AZITHROMYCIN 250 MG PO TABS: ORAL_TABLET | ORAL | 0 refills | Status: DC

## 2017-01-04 NOTE — Progress Notes (Signed)
Subjective  Patient states she has not felt well for the last month. She intermittently has had head congestion sore throat and cough. She currently does not have a sore throat but has significant nasal congestion along with a cough which now is becoming productive. She is a smoker. She is currently on Accutane but has no other chronic medical problems.  Objective  Patient alert and cooperative in no acute distress. There is significant nasal congestion. TMs are clear. Throat is normal. Neck is supple without adenopathy. Chest exam reveals occasional rhonchi on the right.  Assessment   Patient has an upper S2 infection possible allergies which has been lingering and now is developing a productive cough. She has been taking over-the-counter medications Sudafed and TheraFlu without improvement.  Plan Will treat with Z-Pak and renew her Flonase for some fluids and rest.

## 2017-03-04 ENCOUNTER — Ambulatory Visit: Payer: Self-pay

## 2017-04-20 ENCOUNTER — Ambulatory Visit: Payer: Self-pay | Admitting: Family Medicine

## 2017-04-20 VITALS — BP 122/86 | HR 96 | Temp 98.4°F | Resp 20

## 2017-04-20 DIAGNOSIS — J309 Allergic rhinitis, unspecified: Secondary | ICD-10-CM

## 2017-04-20 MED ORDER — LORATADINE 10 MG PO TABS: 10.0000 mg | ORAL_TABLET | Freq: Every day | ORAL | 1 refills | Status: AC | PRN

## 2017-04-20 MED ORDER — FLUTICASONE PROPIONATE 50 MCG/ACT NA SUSP: 2.0000 | Freq: Every day | NASAL | 1 refills | Status: AC

## 2017-04-20 NOTE — Progress Notes (Signed)
Subjective: "allergies"     Alyssa LatinoJanay K Green is a 32 y.o. female who presents for evaluation of rhinorrhea with clear nasal discharge, nonproductive cough, and sneezing for the last 2-3 days.  Patient reports a history of allergic rhinitis with similar symptoms.  Patient reports this feels like her usual allergy symptoms.  Patient used to take Flonase for this but she ran out of this.  Patient thought omeprazole would help this so she brought this over-the-counter.  Denies any other symptoms or concerns. Denies rash, nausea, vomiting, diarrhea, SOB, wheezing, chest or back pain, ear pain, sore throat, difficulty swallowing, confusion, purulent nasal discharge, anosmia/hyposmia, dental pain, facial pressure/unilateral facial pain, pain exacerbated by bending over, headache, body aches, malaise, fatigue, fever, chills, severe symptoms, or initial improvement and then worsening of symptoms. History of smoking, asthma, COPD: Negative for asthma or COPD.  Currently smokes. History of recurrent sinus and/or lung infections: Negative. Medical history: Acne, takes Accutane.  Review of Systems Pertinent items noted in HPI and remainder of comprehensive ROS otherwise negative.     Objective:   Physical Exam General: Awake, alert, and oriented. No acute distress. Well developed, hydrated and nourished. Appears stated age. Nontoxic appearance.  HEENT: No PND noted.  No erythema to posterior oropharynx.  No edema or exudates of pharynx or tonsils. No erythema or bulging of TM.  No erythema/edema to nasal mucosa.  Pale/boggy nasal mucosa.  Clear nasal discharge noted.  Sinuses nontender. Supple neck without adenopathy. Cardiac: Heart rate and rhythm are normal. No murmurs, gallops, or rubs are auscultated. S1 and S2 are heard and are of normal intensity.  Respiratory: No signs of respiratory distress. Lungs clear. No tachypnea. Able to speak in full sentences without dyspnea. Nonlabored respirations.  Skin: Skin is  warm, dry and intact. Appropriate color for ethnicity. No cyanosis noted.   Assessment:    allergic rhinitis   Plan:   Refilled Flonase and provided patient with education on how to take this for maximal benefit.  Prescribed Claritin to take as needed.  Advised her to stop taking omeprazole.  Side/adverse effects discussed.  Provided patient with resources to establish care with a primary care provider and encouraged her to do so soon.  Follow-up with primary care provider or at the clinic if needed if unable to see a new primary care provider soon. Discussed red flag symptoms and circumstances with which to seek medical care.

## 2018-03-16 ENCOUNTER — Telehealth: Payer: 59 | Admitting: Nurse Practitioner

## 2018-03-16 DIAGNOSIS — J0100 Acute maxillary sinusitis, unspecified: Secondary | ICD-10-CM | POA: Diagnosis not present

## 2018-03-16 MED ORDER — AMOXICILLIN-POT CLAVULANATE 875-125 MG PO TABS: 1.0000 | ORAL_TABLET | Freq: Two times a day (BID) | ORAL | 0 refills | Status: AC

## 2018-03-16 NOTE — Progress Notes (Signed)
We are sorry that you are not feeling well.  Here is how we plan to help!  Based on what you have shared with me it looks like you have sinusitis.  Sinusitis is inflammation and infection in the sinus cavities of the head.  Based on your presentation I believe you most likely have Acute Bacterial Sinusitis.  This is an infection caused by bacteria and is treated with antibiotics. I have prescribed Augmentin 875mg/125mg one tablet twice daily with food, for 7 days. You may use an oral decongestant such as Mucinex D or if you have glaucoma or high blood pressure use plain Mucinex. Saline nasal spray help and can safely be used as often as needed for congestion.  If you develop worsening sinus pain, fever or notice severe headache and vision changes, or if symptoms are not better after completion of antibiotic, please schedule an appointment with a health care provider.    Sinus infections are not as easily transmitted as other respiratory infection, however we still recommend that you avoid close contact with loved ones, especially the very young and elderly.  Remember to wash your hands thoroughly throughout the day as this is the number one way to prevent the spread of infection!  Home Care:  Only take medications as instructed by your medical team.  Complete the entire course of an antibiotic.  Do not take these medications with alcohol.  A steam or ultrasonic humidifier can help congestion.  You can place a towel over your head and breathe in the steam from hot water coming from a faucet.  Avoid close contacts especially the very young and the elderly.  Cover your mouth when you cough or sneeze.  Always remember to wash your hands.  Get Help Right Away If:  You develop worsening fever or sinus pain.  You develop a severe head ache or visual changes.  Your symptoms persist after you have completed your treatment plan.  Make sure you  Understand these instructions.  Will watch your  condition.  Will get help right away if you are not doing well or get worse.  Your e-visit answers were reviewed by a board certified advanced clinical practitioner to complete your personal care plan.  Depending on the condition, your plan could have included both over the counter or prescription medications.  If there is a problem please reply  once you have received a response from your provider.  Your safety is important to us.  If you have drug allergies check your prescription carefully.    You can use MyChart to ask questions about today's visit, request a non-urgent call back, or ask for a work or school excuse for 24 hours related to this e-Visit. If it has been greater than 24 hours you will need to follow up with your provider, or enter a new e-Visit to address those concerns.  You will get an e-mail in the next two days asking about your experience.  I hope that your e-visit has been valuable and will speed your recovery. Thank you for using e-visits.   5 minutes spent reviewing and documenting in chart.  

## 2019-08-22 ENCOUNTER — Ambulatory Visit (INDEPENDENT_AMBULATORY_CARE_PROVIDER_SITE_OTHER): Payer: Self-pay

## 2019-08-22 ENCOUNTER — Other Ambulatory Visit: Payer: Self-pay

## 2019-08-22 DIAGNOSIS — Z23 Encounter for immunization: Secondary | ICD-10-CM

## 2019-08-22 NOTE — Progress Notes (Signed)
   Covid-19 Vaccination Clinic  Name:  [name]    MRN: [id number] DOB: 10/30/1985  08/22/2019  [name] was observed post Covid-19 immunization for 15 minutes without incident. She was provided with Vaccine Information Sheet and instruction to access the V-Safe system.   [name] was instructed to call 911 with any severe reactions post vaccine: Marland Kitchen Difficulty breathing  . Swelling of face and throat  . A fast heartbeat  . A bad rash all over body  . Dizziness and weakness   Immunizations Administered    Name Date Dose VIS Date Route   Pfizer COVID-19 Vaccine 08/22/2019  5:10 PM 0.3 mL 03/01/2018 Intramuscular   Manufacturer: [employer], [employer]   Lot: [id number]   NDC: [address]-0

## 2019-09-19 ENCOUNTER — Ambulatory Visit (INDEPENDENT_AMBULATORY_CARE_PROVIDER_SITE_OTHER): Payer: Self-pay

## 2019-09-19 DIAGNOSIS — Z23 Encounter for immunization: Secondary | ICD-10-CM

## 2019-09-19 NOTE — Progress Notes (Signed)
   Covid-19 Vaccination Clinic  Name:  Alyssa Green    MRN: [id number] DOB: 1985/04/20  09/19/2019  [name] was observed post Covid-19 immunization for 15 minutes without incident. She was provided with Vaccine Information Sheet and instruction to access the V-Safe system.   [name] was instructed to call 911 with any severe reactions post vaccine: Marland Kitchen Difficulty breathing  . Swelling of face and throat  . A fast heartbeat  . A bad rash all over body  . Dizziness and weakness   Immunizations Administered    Name Date Dose VIS Date Route   Pfizer COVID-19 Vaccine 09/19/2019  5:02 PM 0.3 mL 03/01/2018 Intramuscular   Manufacturer: [employer], [employer]   Lot: [id number]   NDC: [address]-0

## 2019-09-26 ENCOUNTER — Other Ambulatory Visit: Payer: 59

## 2022-04-15 ENCOUNTER — Other Ambulatory Visit: Payer: Self-pay | Admitting: Obstetrics and Gynecology

## 2022-04-15 DIAGNOSIS — N6320 Unspecified lump in the left breast, unspecified quadrant: Secondary | ICD-10-CM

## 2022-04-21 ENCOUNTER — Ambulatory Visit
Admission: RE | Admit: 2022-04-21 | Discharge: 2022-04-21 | Disposition: A | Discharge status: Home / Self Care | Payer: 59 | Source: Ambulatory Visit | Attending: Obstetrics and Gynecology | Admitting: Obstetrics and Gynecology

## 2022-04-21 ENCOUNTER — Ambulatory Visit
Admission: RE | Admit: 2022-04-21 | Discharge: 2022-04-21 | Disposition: A | Discharge status: Home / Self Care | Payer: Commercial Managed Care - PPO | Source: Ambulatory Visit | Attending: Obstetrics and Gynecology | Admitting: Obstetrics and Gynecology

## 2022-04-21 DIAGNOSIS — N6320 Unspecified lump in the left breast, unspecified quadrant: Secondary | ICD-10-CM

## 2022-04-29 ENCOUNTER — Other Ambulatory Visit: Payer: Commercial Managed Care - PPO

## 2023-07-19 ENCOUNTER — Other Ambulatory Visit: Payer: Self-pay

## 2023-07-19 DIAGNOSIS — Z124 Encounter for screening for malignant neoplasm of cervix: Secondary | ICD-10-CM | POA: Diagnosis not present

## 2023-07-19 DIAGNOSIS — Z1389 Encounter for screening for other disorder: Secondary | ICD-10-CM | POA: Diagnosis not present

## 2023-07-19 DIAGNOSIS — Z01419 Encounter for gynecological examination (general) (routine) without abnormal findings: Secondary | ICD-10-CM | POA: Diagnosis not present

## 2023-07-19 DIAGNOSIS — R300 Dysuria: Secondary | ICD-10-CM | POA: Diagnosis not present

## 2023-07-19 DIAGNOSIS — Z3041 Encounter for surveillance of contraceptive pills: Secondary | ICD-10-CM | POA: Diagnosis not present

## 2023-07-19 DIAGNOSIS — L659 Nonscarring hair loss, unspecified: Secondary | ICD-10-CM | POA: Diagnosis not present

## 2023-07-19 DIAGNOSIS — Z130 Encounter for screening for diseases of the blood and blood-forming organs and certain disorders involving the immune mechanism: Secondary | ICD-10-CM | POA: Diagnosis not present

## 2023-07-19 DIAGNOSIS — Z1151 Encounter for screening for human papillomavirus (HPV): Secondary | ICD-10-CM | POA: Diagnosis not present

## 2023-07-19 MED ORDER — SPIRONOLACTONE 50 MG PO TABS
50.0000 mg | ORAL_TABLET | Freq: Every day | ORAL | 5 refills | Status: DC
  Filled 2023-07-19: qty 90, 90d supply, fill #0

## 2023-07-19 MED ORDER — LEVONORGESTREL-ETHINYL ESTRAD 0.1-20 MG-MCG PO TABS
1.0000 | ORAL_TABLET | Freq: Every day | ORAL | 0 refills | Status: AC
  Filled 2023-07-19: qty 84, 84d supply, fill #0

## 2023-07-22 ENCOUNTER — Other Ambulatory Visit: Payer: Self-pay

## 2023-07-22 MED ORDER — SULFAMETHOXAZOLE-TRIMETHOPRIM 800-160 MG PO TABS
1.0000 | ORAL_TABLET | Freq: Two times a day (BID) | ORAL | 0 refills | Status: DC
  Filled 2023-07-22: qty 10, 5d supply, fill #0

## 2023-07-23 ENCOUNTER — Other Ambulatory Visit: Payer: Self-pay

## 2023-09-27 ENCOUNTER — Telehealth: Admitting: Family Medicine

## 2023-09-27 ENCOUNTER — Other Ambulatory Visit: Payer: Self-pay

## 2023-09-27 DIAGNOSIS — R399 Unspecified symptoms and signs involving the genitourinary system: Secondary | ICD-10-CM

## 2023-09-27 MED ORDER — CEPHALEXIN 500 MG PO CAPS
500.0000 mg | ORAL_CAPSULE | Freq: Two times a day (BID) | ORAL | 0 refills | Status: AC
  Filled 2023-09-27: qty 14, 7d supply, fill #0

## 2023-09-27 NOTE — Patient Instructions (Signed)

## 2023-09-27 NOTE — Progress Notes (Signed)
 Virtual Visit Consent   [name], you are scheduled for a virtual visit with a [facility] provider today. Just as with appointments in the office, your consent must be obtained to participate. Your consent will be active for this visit and any virtual visit you may have with one of our providers in the next 365 days. If you have a MyChart account, a copy of this consent can be sent to you electronically.  As this is a virtual visit, video technology does not allow for your provider to perform a traditional examination. This may limit your provider's ability to fully assess your condition. If your provider identifies any concerns that need to be evaluated in person or the need to arrange testing (such as labs, EKG, etc.), we will make arrangements to do so. Although advances in technology are sophisticated, we cannot ensure that it will always work on either your end or our end. If the connection with a video visit is poor, the visit may have to be switched to a telephone visit. With either a video or telephone visit, we are not always able to ensure that we have a secure connection.  By engaging in this virtual visit, you consent to the provision of healthcare and authorize for your insurance to be billed (if applicable) for the services provided during this visit. Depending on your insurance coverage, you may receive a charge related to this service.  I need to obtain your verbal consent now. Are you willing to proceed with your visit today? Mykalah K Mccauley has provided verbal consent on 09/27/2023 for a virtual visit (video or telephone). Loa Lamp, FNP  Date: 09/27/2023 5:37 PM   Virtual Visit via Video Note   I, Loa Lamp, connected with  Amily K Langer  ([id number], 1985-04-10) on 09/27/23 at  5:45 PM EDT by a video-enabled telemedicine application and verified that I am speaking with the correct person using two identifiers.  Location: Patient: Virtual Visit Location Patient:  Home Provider: Virtual Visit Location Provider: Home Office   I discussed the limitations of evaluation and management by telemedicine and the availability of in person appointments. The patient expressed understanding and agreed to proceed.    History of Present Illness: Alyssa Green is a 38 y.o. who identifies as a female who was assigned female at birth, and is being seen today for burning and frequency on urination. No fever or abd pain. Sx for 2 days worsening. SABRA  HPI: HPI  Problems:  Patient Active Problem List   Diagnosis Date Noted   Right hip pain 11/20/2014   Vitamin D  deficiency 11/20/2014   Obesity, Class I, BMI 30-34.9 11/19/2014   Acne vulgaris 11/19/2014   FH: diabetes mellitus 11/19/2014   Hereditary disease in family possibly affecting fetus, affecting management of mother, antepartum condition or complication 10/08/2011    Allergies:  Allergies  Allergen Reactions   Other    Medications:  Current Outpatient Medications:    amoxicillin -clavulanate (AUGMENTIN ) 875-125 MG tablet, Take 1 tablet by mouth 2 (two) times daily., Disp: 14 tablet, Rfl: 0   fluticasone  (FLONASE ) 50 MCG/ACT nasal spray, Place 2 sprays into both nostrils daily., Disp: 16 g, Rfl: 1   ISOtretinoin (ACCUTANE) 40 MG capsule, Take 40 mg by mouth., Disp: , Rfl:    levonorgestrel -ethinyl estradiol  (VIENVA ) 0.1-20 MG-MCG tablet, TAKE 1 TABLET BY MOUTH EVERY DAY, Disp: 84 tablet, Rfl: 0   loratadine  (CLARITIN ) 10 MG tablet, Take 1 tablet (10 mg total) by mouth  daily as needed for allergies., Disp: 30 tablet, Rfl: 1   spironolactone  (ALDACTONE ) 50 MG tablet, Take 1 tablet (50 mg total) by mouth daily., Disp: 90 tablet, Rfl: 5   sulfamethoxazole -trimethoprim  (BACTRIM  DS) 800-160 MG tablet, Take 1 tablet by mouth every 12 (twelve) hours for 5 days., Disp: 10 tablet, Rfl: 0  Observations/Objective: Patient is well-developed, well-nourished in no acute distress.  Resting comfortably  at home.  Head is  normocephalic, atraumatic.  No labored breathing.  Speech is clear and coherent with logical content.  Patient is alert and oriented at baseline.    Assessment and Plan: 1. UTI symptoms (Primary)  Increase fluids, preventative measures discussed, UC as needed. She is not pregnant.   Follow Up Instructions: I discussed the assessment and treatment plan with the patient. The patient was provided an opportunity to ask questions and all were answered. The patient agreed with the plan and demonstrated an understanding of the instructions.  A copy of instructions were sent to the patient via MyChart unless otherwise noted below.     The patient was advised to call back or seek an in-person evaluation if the symptoms worsen or if the condition fails to improve as anticipated.    Denelda Akerley, FNP

## 2024-01-28 ENCOUNTER — Other Ambulatory Visit: Payer: Self-pay

## 2024-01-28 MED ORDER — TERCONAZOLE 0.8 % VA CREA
TOPICAL_CREAM | VAGINAL | 0 refills | Status: AC
  Filled 2024-01-28: qty 20, 3d supply, fill #0

## 2024-02-05 ENCOUNTER — Other Ambulatory Visit: Payer: Self-pay

## 2024-02-05 MED ORDER — NITROFURANTOIN MONOHYD MACRO 100 MG PO CAPS
100.0000 mg | ORAL_CAPSULE | Freq: Two times a day (BID) | ORAL | 0 refills | Status: AC
  Filled 2024-02-05: qty 14, 7d supply, fill #0
